# Patient Record
Sex: Female | Born: 1955 | Race: White | Hispanic: No | Smoking: Never smoker
Health system: Southern US, Community
[De-identification: ages and names within clinical notes are randomized; demographics above are authoritative.]

## PROBLEM LIST (undated history)

## (undated) DIAGNOSIS — Z923 Personal history of irradiation: Secondary | ICD-10-CM

## (undated) DIAGNOSIS — I10 Essential (primary) hypertension: Secondary | ICD-10-CM

## (undated) DIAGNOSIS — C50919 Malignant neoplasm of unspecified site of unspecified female breast: Secondary | ICD-10-CM

## (undated) DIAGNOSIS — S72453A Displaced supracondylar fracture without intracondylar extension of lower end of unspecified femur, initial encounter for closed fracture: Secondary | ICD-10-CM

## (undated) DIAGNOSIS — Z9221 Personal history of antineoplastic chemotherapy: Secondary | ICD-10-CM

## (undated) DIAGNOSIS — R7303 Prediabetes: Secondary | ICD-10-CM

## (undated) DIAGNOSIS — E559 Vitamin D deficiency, unspecified: Secondary | ICD-10-CM

---

## 2001-01-07 ENCOUNTER — Emergency Department (HOSPITAL_COMMUNITY): Admission: EM | Admit: 2001-01-07 | Discharge: 2001-01-07 | Payer: Self-pay | Admitting: Emergency Medicine

## 2001-02-15 ENCOUNTER — Ambulatory Visit (HOSPITAL_COMMUNITY): Admission: RE | Admit: 2001-02-15 | Discharge: 2001-02-15 | Payer: Self-pay | Admitting: Orthopedic Surgery

## 2001-02-15 ENCOUNTER — Encounter: Payer: Self-pay | Admitting: Orthopedic Surgery

## 2001-04-13 ENCOUNTER — Emergency Department (HOSPITAL_COMMUNITY): Admission: EM | Admit: 2001-04-13 | Discharge: 2001-04-13 | Payer: Self-pay | Admitting: Emergency Medicine

## 2003-04-14 HISTORY — PX: CARPAL TUNNEL RELEASE: SHX101

## 2003-08-30 ENCOUNTER — Ambulatory Visit (HOSPITAL_BASED_OUTPATIENT_CLINIC_OR_DEPARTMENT_OTHER): Admission: RE | Admit: 2003-08-30 | Discharge: 2003-08-30 | Payer: Self-pay | Admitting: Orthopedic Surgery

## 2005-04-30 ENCOUNTER — Ambulatory Visit: Payer: Self-pay | Admitting: Family Medicine

## 2006-07-18 ENCOUNTER — Emergency Department (HOSPITAL_COMMUNITY): Admission: EM | Admit: 2006-07-18 | Discharge: 2006-07-18 | Payer: Self-pay | Admitting: Emergency Medicine

## 2006-08-16 ENCOUNTER — Emergency Department (HOSPITAL_COMMUNITY): Admission: EM | Admit: 2006-08-16 | Discharge: 2006-08-16 | Payer: Self-pay | Admitting: Emergency Medicine

## 2006-12-01 ENCOUNTER — Ambulatory Visit: Payer: Self-pay | Admitting: Oncology

## 2006-12-03 ENCOUNTER — Encounter (HOSPITAL_COMMUNITY): Admission: RE | Admit: 2006-12-03 | Discharge: 2007-01-05 | Payer: Self-pay | Admitting: Oncology

## 2006-12-05 ENCOUNTER — Encounter: Admission: RE | Admit: 2006-12-05 | Discharge: 2006-12-05 | Payer: Self-pay | Admitting: General Surgery

## 2006-12-07 ENCOUNTER — Ambulatory Visit (HOSPITAL_COMMUNITY): Admission: RE | Admit: 2006-12-07 | Discharge: 2006-12-07 | Payer: Self-pay | Admitting: Oncology

## 2006-12-08 LAB — CBC WITH DIFFERENTIAL/PLATELET
BASO%: 0.5 % (ref 0.0–2.0)
Basophils Absolute: 0 10*3/uL (ref 0.0–0.1)
EOS%: 1.8 % (ref 0.0–7.0)
Eosinophils Absolute: 0.1 10*3/uL (ref 0.0–0.5)
HCT: 37.7 % (ref 34.8–46.6)
HGB: 13.5 g/dL (ref 11.6–15.9)
LYMPH%: 17.2 % (ref 14.0–48.0)
MCH: 30.7 pg (ref 26.0–34.0)
MCHC: 35.8 g/dL (ref 32.0–36.0)
MCV: 85.7 fL (ref 81.0–101.0)
MONO#: 0.5 10*3/uL (ref 0.1–0.9)
MONO%: 6.8 % (ref 0.0–13.0)
NEUT#: 5.3 10*3/uL (ref 1.5–6.5)
NEUT%: 73.7 % (ref 39.6–76.8)
Platelets: 283 10*3/uL (ref 145–400)
RBC: 4.4 10*6/uL (ref 3.70–5.32)
RDW: 13 % (ref 11.3–14.5)
WBC: 7.2 10*3/uL (ref 3.9–10.0)
lymph#: 1.2 10*3/uL (ref 0.9–3.3)

## 2006-12-10 ENCOUNTER — Ambulatory Visit (HOSPITAL_COMMUNITY): Admission: RE | Admit: 2006-12-10 | Discharge: 2006-12-10 | Payer: Self-pay | Admitting: Oncology

## 2006-12-12 LAB — VITAMIN D PNL(25-HYDRXY+1,25-DIHY)-BLD
Vit D, 1,25-Dihydroxy: 56 pg/mL (ref 6–62)
Vit D, 25-Hydroxy: 24 ng/mL (ref 20–57)

## 2006-12-12 LAB — COMPREHENSIVE METABOLIC PANEL
ALT: 18 U/L (ref 0–35)
AST: 18 U/L (ref 0–37)
Albumin: 4.1 g/dL (ref 3.5–5.2)
Alkaline Phosphatase: 95 U/L (ref 39–117)
Glucose, Bld: 119 mg/dL — ABNORMAL HIGH (ref 70–99)
Potassium: 3.9 mEq/L (ref 3.5–5.3)
Sodium: 138 mEq/L (ref 135–145)
Total Bilirubin: 0.4 mg/dL (ref 0.3–1.2)
Total Protein: 6.3 g/dL (ref 6.0–8.3)

## 2006-12-12 LAB — LACTATE DEHYDROGENASE: LDH: 174 U/L (ref 94–250)

## 2006-12-12 LAB — CANCER ANTIGEN 27.29: CA 27.29: 15 U/mL (ref 0–39)

## 2006-12-17 ENCOUNTER — Ambulatory Visit (HOSPITAL_BASED_OUTPATIENT_CLINIC_OR_DEPARTMENT_OTHER): Admission: RE | Admit: 2006-12-17 | Discharge: 2006-12-17 | Payer: Self-pay | Admitting: General Surgery

## 2006-12-17 ENCOUNTER — Encounter (INDEPENDENT_AMBULATORY_CARE_PROVIDER_SITE_OTHER): Payer: Self-pay | Admitting: General Surgery

## 2006-12-21 ENCOUNTER — Ambulatory Visit: Admission: EM | Admit: 2006-12-21 | Discharge: 2006-12-21 | Payer: Self-pay | Admitting: Oncology

## 2006-12-21 ENCOUNTER — Encounter: Payer: Self-pay | Admitting: Oncology

## 2007-01-04 LAB — CBC WITH DIFFERENTIAL/PLATELET
EOS%: 0.4 % (ref 0.0–7.0)
Eosinophils Absolute: 0.1 10*3/uL (ref 0.0–0.5)
LYMPH%: 4.5 % — ABNORMAL LOW (ref 14.0–48.0)
MCH: 30.4 pg (ref 26.0–34.0)
MCV: 87 fL (ref 81.0–101.0)
MONO%: 0.2 % (ref 0.0–13.0)
NEUT#: 18.6 10*3/uL — ABNORMAL HIGH (ref 1.5–6.5)
Platelets: 194 10*3/uL (ref 145–400)
RBC: 4.29 10*6/uL (ref 3.70–5.32)

## 2007-01-04 LAB — COMPREHENSIVE METABOLIC PANEL
AST: 23 U/L (ref 0–37)
Alkaline Phosphatase: 122 U/L — ABNORMAL HIGH (ref 39–117)
BUN: 12 mg/dL (ref 6–23)
Glucose, Bld: 109 mg/dL — ABNORMAL HIGH (ref 70–99)
Sodium: 137 mEq/L (ref 135–145)
Total Bilirubin: 0.6 mg/dL (ref 0.3–1.2)
Total Protein: 5.7 g/dL — ABNORMAL LOW (ref 6.0–8.3)

## 2007-01-14 LAB — CBC WITH DIFFERENTIAL/PLATELET
Basophils Absolute: 0 10*3/uL (ref 0.0–0.1)
Eosinophils Absolute: 0 10*3/uL (ref 0.0–0.5)
HCT: 34.7 % — ABNORMAL LOW (ref 34.8–46.6)
HGB: 12.3 g/dL (ref 11.6–15.9)
LYMPH%: 14.7 % (ref 14.0–48.0)
MCV: 86.1 fL (ref 81.0–101.0)
MONO%: 8.1 % (ref 0.0–13.0)
NEUT#: 6.6 10*3/uL — ABNORMAL HIGH (ref 1.5–6.5)
NEUT%: 76.4 % (ref 39.6–76.8)
Platelets: 182 10*3/uL (ref 145–400)

## 2007-01-19 ENCOUNTER — Ambulatory Visit: Payer: Self-pay | Admitting: Oncology

## 2007-01-21 LAB — CBC WITH DIFFERENTIAL/PLATELET
BASO%: 0.4 % (ref 0.0–2.0)
EOS%: 0.3 % (ref 0.0–7.0)
LYMPH%: 13.6 % — ABNORMAL LOW (ref 14.0–48.0)
MCH: 30.6 pg (ref 26.0–34.0)
MCHC: 35.3 g/dL (ref 32.0–36.0)
MONO#: 0.5 10*3/uL (ref 0.1–0.9)
NEUT%: 79 % — ABNORMAL HIGH (ref 39.6–76.8)
Platelets: 334 10*3/uL (ref 145–400)
RBC: 3.92 10*6/uL (ref 3.70–5.32)
WBC: 8.1 10*3/uL (ref 3.9–10.0)
lymph#: 1.1 10*3/uL (ref 0.9–3.3)

## 2007-01-28 LAB — CBC WITH DIFFERENTIAL/PLATELET
BASO%: 0.7 % (ref 0.0–2.0)
HCT: 33.3 % — ABNORMAL LOW (ref 34.8–46.6)
LYMPH%: 28.4 % (ref 14.0–48.0)
MCHC: 35.4 g/dL (ref 32.0–36.0)
MCV: 87 fL (ref 81.0–101.0)
MONO%: 4.5 % (ref 0.0–13.0)
NEUT%: 62.9 % (ref 39.6–76.8)
Platelets: 129 10*3/uL — ABNORMAL LOW (ref 145–400)
RBC: 3.82 10*6/uL (ref 3.70–5.32)

## 2007-02-10 LAB — CBC WITH DIFFERENTIAL/PLATELET
BASO%: 0.4 % (ref 0.0–2.0)
EOS%: 1.3 % (ref 0.0–7.0)
HCT: 34 % — ABNORMAL LOW (ref 34.8–46.6)
MCH: 31.3 pg (ref 26.0–34.0)
MCHC: 35.7 g/dL (ref 32.0–36.0)
MONO#: 1 10*3/uL — ABNORMAL HIGH (ref 0.1–0.9)
NEUT%: 71 % (ref 39.6–76.8)
RBC: 3.89 10*6/uL (ref 3.70–5.32)
RDW: 15.1 % — ABNORMAL HIGH (ref 11.3–14.5)
WBC: 8 10*3/uL (ref 3.9–10.0)
lymph#: 1.2 10*3/uL (ref 0.9–3.3)

## 2007-02-10 LAB — COMPREHENSIVE METABOLIC PANEL
ALT: 34 U/L (ref 0–35)
AST: 25 U/L (ref 0–37)
Albumin: 3.5 g/dL (ref 3.5–5.2)
CO2: 31 mEq/L (ref 19–32)
Calcium: 9 mg/dL (ref 8.4–10.5)
Chloride: 102 mEq/L (ref 96–112)
Potassium: 3.8 mEq/L (ref 3.5–5.3)
Sodium: 141 mEq/L (ref 135–145)
Total Protein: 6 g/dL (ref 6.0–8.3)

## 2007-02-11 ENCOUNTER — Ambulatory Visit (HOSPITAL_COMMUNITY): Admission: RE | Admit: 2007-02-11 | Discharge: 2007-02-11 | Payer: Self-pay | Admitting: Oncology

## 2007-03-02 ENCOUNTER — Ambulatory Visit: Payer: Self-pay | Admitting: Oncology

## 2007-03-03 LAB — CBC WITH DIFFERENTIAL/PLATELET
BASO%: 0.2 % (ref 0.0–2.0)
EOS%: 0.1 % (ref 0.0–7.0)
HCT: 32.9 % — ABNORMAL LOW (ref 34.8–46.6)
MCH: 31.5 pg (ref 26.0–34.0)
MCHC: 35.8 g/dL (ref 32.0–36.0)
MONO#: 0.7 10*3/uL (ref 0.1–0.9)
NEUT%: 75.5 % (ref 39.6–76.8)
RBC: 3.74 10*6/uL (ref 3.70–5.32)
WBC: 7.4 10*3/uL (ref 3.9–10.0)
lymph#: 1 10*3/uL (ref 0.9–3.3)

## 2007-03-14 LAB — CBC WITH DIFFERENTIAL/PLATELET
Eosinophils Absolute: 0.1 10*3/uL (ref 0.0–0.5)
MONO#: 0.8 10*3/uL (ref 0.1–0.9)
NEUT#: 11.1 10*3/uL — ABNORMAL HIGH (ref 1.5–6.5)
Platelets: 142 10*3/uL — ABNORMAL LOW (ref 145–400)
RBC: 3.59 10*6/uL — ABNORMAL LOW (ref 3.70–5.32)
RDW: 13.2 % (ref 11.3–14.5)
WBC: 13.7 10*3/uL — ABNORMAL HIGH (ref 3.9–10.0)

## 2007-03-17 ENCOUNTER — Ambulatory Visit: Admission: RE | Admit: 2007-03-17 | Discharge: 2007-03-17 | Payer: Self-pay | Admitting: Oncology

## 2007-03-17 ENCOUNTER — Encounter: Payer: Self-pay | Admitting: Oncology

## 2007-03-24 LAB — CBC WITH DIFFERENTIAL/PLATELET
Eosinophils Absolute: 0 10*3/uL (ref 0.0–0.5)
HCT: 32.8 % — ABNORMAL LOW (ref 34.8–46.6)
HGB: 11.4 g/dL — ABNORMAL LOW (ref 11.6–15.9)
LYMPH%: 15.3 % (ref 14.0–48.0)
MONO#: 0.7 10*3/uL (ref 0.1–0.9)
NEUT#: 5 10*3/uL (ref 1.5–6.5)
NEUT%: 73.6 % (ref 39.6–76.8)
Platelets: 320 10*3/uL (ref 145–400)
WBC: 6.8 10*3/uL (ref 3.9–10.0)

## 2007-03-24 LAB — COMPREHENSIVE METABOLIC PANEL
CO2: 28 mEq/L (ref 19–32)
Calcium: 9 mg/dL (ref 8.4–10.5)
Creatinine, Ser: 0.89 mg/dL (ref 0.40–1.20)
Glucose, Bld: 158 mg/dL — ABNORMAL HIGH (ref 70–99)
Total Bilirubin: 0.8 mg/dL (ref 0.3–1.2)
Total Protein: 5.9 g/dL — ABNORMAL LOW (ref 6.0–8.3)

## 2007-03-31 ENCOUNTER — Ambulatory Visit (HOSPITAL_COMMUNITY): Admission: RE | Admit: 2007-03-31 | Discharge: 2007-03-31 | Payer: Self-pay | Admitting: Oncology

## 2007-04-01 LAB — COMPREHENSIVE METABOLIC PANEL
ALT: 68 U/L — ABNORMAL HIGH (ref 0–35)
Albumin: 3.8 g/dL (ref 3.5–5.2)
CO2: 26 mEq/L (ref 19–32)
Calcium: 8.6 mg/dL (ref 8.4–10.5)
Chloride: 106 mEq/L (ref 96–112)
Glucose, Bld: 129 mg/dL — ABNORMAL HIGH (ref 70–99)
Potassium: 3.4 mEq/L — ABNORMAL LOW (ref 3.5–5.3)
Sodium: 141 mEq/L (ref 135–145)
Total Bilirubin: 0.9 mg/dL (ref 0.3–1.2)
Total Protein: 6.1 g/dL (ref 6.0–8.3)

## 2007-04-01 LAB — CBC WITH DIFFERENTIAL/PLATELET
Eosinophils Absolute: 0 10*3/uL (ref 0.0–0.5)
HCT: 29.8 % — ABNORMAL LOW (ref 34.8–46.6)
LYMPH%: 11 % — ABNORMAL LOW (ref 14.0–48.0)
MONO#: 0.8 10*3/uL (ref 0.1–0.9)
NEUT#: 3.8 10*3/uL (ref 1.5–6.5)
Platelets: 199 10*3/uL (ref 145–400)
RBC: 3.26 10*6/uL — ABNORMAL LOW (ref 3.70–5.32)
WBC: 5.3 10*3/uL (ref 3.9–10.0)
lymph#: 0.6 10*3/uL — ABNORMAL LOW (ref 0.9–3.3)

## 2007-04-01 LAB — D-DIMER, QUANTITATIVE: D-Dimer, Quant: 0.3 ug/mL-FEU (ref 0.00–0.48)

## 2007-04-08 ENCOUNTER — Ambulatory Visit: Payer: Self-pay | Admitting: Oncology

## 2007-04-08 LAB — CBC WITH DIFFERENTIAL/PLATELET
BASO%: 1 % (ref 0.0–2.0)
Basophils Absolute: 0 10*3/uL (ref 0.0–0.1)
EOS%: 3.8 % (ref 0.0–7.0)
MCH: 32.2 pg (ref 26.0–34.0)
MCHC: 34.8 g/dL (ref 32.0–36.0)
MCV: 92.5 fL (ref 81.0–101.0)
MONO%: 7.8 % (ref 0.0–13.0)
RBC: 3.59 10*6/uL — ABNORMAL LOW (ref 3.70–5.32)
RDW: 15.5 % — ABNORMAL HIGH (ref 11.3–14.5)

## 2007-04-08 LAB — COMPREHENSIVE METABOLIC PANEL
ALT: 36 U/L — ABNORMAL HIGH (ref 0–35)
AST: 22 U/L (ref 0–37)
Albumin: 3.3 g/dL — ABNORMAL LOW (ref 3.5–5.2)
Alkaline Phosphatase: 63 U/L (ref 39–117)
BUN: 15 mg/dL (ref 6–23)
Potassium: 3.8 mEq/L (ref 3.5–5.3)
Sodium: 142 mEq/L (ref 135–145)

## 2007-04-09 ENCOUNTER — Emergency Department (HOSPITAL_COMMUNITY): Admission: EM | Admit: 2007-04-09 | Discharge: 2007-04-10 | Payer: Self-pay | Admitting: Emergency Medicine

## 2007-04-11 ENCOUNTER — Ambulatory Visit: Admission: RE | Admit: 2007-04-11 | Discharge: 2007-04-11 | Payer: Self-pay | Admitting: Oncology

## 2007-04-14 ENCOUNTER — Encounter: Payer: Self-pay | Admitting: Family Medicine

## 2007-04-14 DIAGNOSIS — C50919 Malignant neoplasm of unspecified site of unspecified female breast: Secondary | ICD-10-CM

## 2007-04-14 HISTORY — PX: BREAST LUMPECTOMY: SHX2

## 2007-04-14 HISTORY — DX: Malignant neoplasm of unspecified site of unspecified female breast: C50.919

## 2007-04-21 LAB — CBC WITH DIFFERENTIAL/PLATELET
Basophils Absolute: 0.1 10*3/uL (ref 0.0–0.1)
EOS%: 3 % (ref 0.0–7.0)
HGB: 12.3 g/dL (ref 11.6–15.9)
MCH: 32 pg (ref 26.0–34.0)
MCV: 91.4 fL (ref 81.0–101.0)
MONO%: 10.9 % (ref 0.0–13.0)
NEUT%: 66.2 % (ref 39.6–76.8)
RDW: 13.9 % (ref 11.3–14.5)

## 2007-04-21 LAB — COMPREHENSIVE METABOLIC PANEL
AST: 24 U/L (ref 0–37)
Alkaline Phosphatase: 80 U/L (ref 39–117)
BUN: 9 mg/dL (ref 6–23)
Creatinine, Ser: 0.73 mg/dL (ref 0.40–1.20)
Potassium: 4.1 mEq/L (ref 3.5–5.3)

## 2007-04-29 LAB — CBC WITH DIFFERENTIAL/PLATELET
Basophils Absolute: 0.1 10*3/uL (ref 0.0–0.1)
EOS%: 6.2 % (ref 0.0–7.0)
HCT: 34.6 % — ABNORMAL LOW (ref 34.8–46.6)
HGB: 11.9 g/dL (ref 11.6–15.9)
MCH: 31.6 pg (ref 26.0–34.0)
MCV: 91.7 fL (ref 81.0–101.0)
MONO%: 7 % (ref 0.0–13.0)
NEUT%: 71.2 % (ref 39.6–76.8)
Platelets: 243 10*3/uL (ref 145–400)

## 2007-05-04 ENCOUNTER — Ambulatory Visit (HOSPITAL_COMMUNITY): Admission: RE | Admit: 2007-05-04 | Discharge: 2007-05-04 | Payer: Self-pay | Admitting: Oncology

## 2007-05-04 LAB — CBC WITH DIFFERENTIAL/PLATELET
BASO%: 0.9 % (ref 0.0–2.0)
Basophils Absolute: 0.1 10*3/uL (ref 0.0–0.1)
Eosinophils Absolute: 0.2 10*3/uL (ref 0.0–0.5)
HCT: 34.3 % — ABNORMAL LOW (ref 34.8–46.6)
HGB: 12.1 g/dL (ref 11.6–15.9)
LYMPH%: 16.8 % (ref 14.0–48.0)
MONO#: 0.2 10*3/uL (ref 0.1–0.9)
NEUT#: 4.2 10*3/uL (ref 1.5–6.5)
NEUT%: 75.1 % (ref 39.6–76.8)
Platelets: 282 10*3/uL (ref 145–400)
WBC: 5.6 10*3/uL (ref 3.9–10.0)
lymph#: 0.9 10*3/uL (ref 0.9–3.3)

## 2007-05-05 LAB — URINALYSIS, MICROSCOPIC - CHCC
Blood: NEGATIVE
Ketones: NEGATIVE mg/dL
Nitrite: NEGATIVE
Protein: NEGATIVE mg/dL
Specific Gravity, Urine: 1.02 (ref 1.003–1.035)

## 2007-05-19 ENCOUNTER — Encounter: Payer: Self-pay | Admitting: Oncology

## 2007-05-19 ENCOUNTER — Ambulatory Visit: Admission: RE | Admit: 2007-05-19 | Discharge: 2007-05-19 | Payer: Self-pay | Admitting: Oncology

## 2007-05-19 LAB — RESEARCH LABS

## 2007-05-19 LAB — COMPREHENSIVE METABOLIC PANEL
ALT: 38 U/L — ABNORMAL HIGH (ref 0–35)
Albumin: 4.5 g/dL (ref 3.5–5.2)
Alkaline Phosphatase: 97 U/L (ref 39–117)
CO2: 27 mEq/L (ref 19–32)
Glucose, Bld: 132 mg/dL — ABNORMAL HIGH (ref 70–99)
Potassium: 4.1 mEq/L (ref 3.5–5.3)
Sodium: 144 mEq/L (ref 135–145)
Total Protein: 6.5 g/dL (ref 6.0–8.3)

## 2007-05-19 LAB — CBC WITH DIFFERENTIAL/PLATELET
Basophils Absolute: 0 10*3/uL (ref 0.0–0.1)
Eosinophils Absolute: 0.5 10*3/uL (ref 0.0–0.5)
HGB: 13.1 g/dL (ref 11.6–15.9)
NEUT#: 5.4 10*3/uL (ref 1.5–6.5)
RDW: 12.7 % (ref 11.3–14.5)
WBC: 7.6 10*3/uL (ref 3.9–10.0)
lymph#: 1.2 10*3/uL (ref 0.9–3.3)

## 2007-05-22 ENCOUNTER — Encounter: Admission: RE | Admit: 2007-05-22 | Discharge: 2007-05-22 | Payer: Self-pay | Admitting: Oncology

## 2007-06-30 ENCOUNTER — Ambulatory Visit: Payer: Self-pay | Admitting: Oncology

## 2007-07-04 LAB — CBC WITH DIFFERENTIAL/PLATELET
Eosinophils Absolute: 0.2 10*3/uL (ref 0.0–0.5)
LYMPH%: 18.4 % (ref 14.0–48.0)
MCHC: 35.1 g/dL (ref 32.0–36.0)
MCV: 87.4 fL (ref 81.0–101.0)
MONO%: 6.2 % (ref 0.0–13.0)
NEUT#: 4.8 10*3/uL (ref 1.5–6.5)
Platelets: 245 10*3/uL (ref 145–400)
RBC: 4.38 10*6/uL (ref 3.70–5.32)

## 2007-07-04 LAB — COMPREHENSIVE METABOLIC PANEL
Alkaline Phosphatase: 100 U/L (ref 39–117)
Creatinine, Ser: 0.81 mg/dL (ref 0.40–1.20)
Glucose, Bld: 105 mg/dL — ABNORMAL HIGH (ref 70–99)
Sodium: 143 mEq/L (ref 135–145)
Total Bilirubin: 0.5 mg/dL (ref 0.3–1.2)
Total Protein: 6.5 g/dL (ref 6.0–8.3)

## 2007-07-13 ENCOUNTER — Encounter (INDEPENDENT_AMBULATORY_CARE_PROVIDER_SITE_OTHER): Payer: Self-pay | Admitting: General Surgery

## 2007-07-13 ENCOUNTER — Ambulatory Visit (HOSPITAL_BASED_OUTPATIENT_CLINIC_OR_DEPARTMENT_OTHER): Admission: RE | Admit: 2007-07-13 | Discharge: 2007-07-13 | Payer: Self-pay | Admitting: General Surgery

## 2007-08-04 LAB — COMPREHENSIVE METABOLIC PANEL
AST: 20 U/L (ref 0–37)
Albumin: 4.2 g/dL (ref 3.5–5.2)
Alkaline Phosphatase: 118 U/L — ABNORMAL HIGH (ref 39–117)
Potassium: 3.9 mEq/L (ref 3.5–5.3)
Sodium: 143 mEq/L (ref 135–145)
Total Protein: 6.6 g/dL (ref 6.0–8.3)

## 2007-08-04 LAB — CBC WITH DIFFERENTIAL/PLATELET
EOS%: 2.1 % (ref 0.0–7.0)
MCH: 30.4 pg (ref 26.0–34.0)
MCV: 85.8 fL (ref 81.0–101.0)
MONO%: 6.2 % (ref 0.0–13.0)
NEUT#: 5.4 10*3/uL (ref 1.5–6.5)
RBC: 4.37 10*6/uL (ref 3.70–5.32)
RDW: 12.9 % (ref 11.3–14.5)

## 2007-08-08 ENCOUNTER — Ambulatory Visit: Admission: RE | Admit: 2007-08-08 | Discharge: 2007-11-06 | Payer: Self-pay | Admitting: Radiation Oncology

## 2007-08-08 ENCOUNTER — Ambulatory Visit (HOSPITAL_COMMUNITY): Admission: RE | Admit: 2007-08-08 | Discharge: 2007-08-08 | Payer: Self-pay | Admitting: Oncology

## 2007-08-12 LAB — CBC WITH DIFFERENTIAL/PLATELET
BASO%: 0.8 % (ref 0.0–2.0)
EOS%: 2.3 % (ref 0.0–7.0)
HCT: 37.3 % (ref 34.8–46.6)
LYMPH%: 18 % (ref 14.0–48.0)
MCH: 30.1 pg (ref 26.0–34.0)
MCHC: 34.9 g/dL (ref 32.0–36.0)
NEUT%: 71.1 % (ref 39.6–76.8)
RBC: 4.31 10*6/uL (ref 3.70–5.32)
lymph#: 1.2 10*3/uL (ref 0.9–3.3)

## 2007-08-12 LAB — COMPREHENSIVE METABOLIC PANEL
ALT: 33 U/L (ref 0–35)
AST: 24 U/L (ref 0–37)
Creatinine, Ser: 0.79 mg/dL (ref 0.40–1.20)
Sodium: 141 mEq/L (ref 135–145)
Total Bilirubin: 0.5 mg/dL (ref 0.3–1.2)

## 2007-09-02 ENCOUNTER — Ambulatory Visit: Payer: Self-pay | Admitting: Oncology

## 2007-09-27 ENCOUNTER — Encounter: Payer: Self-pay | Admitting: Oncology

## 2007-09-27 ENCOUNTER — Ambulatory Visit: Admission: RE | Admit: 2007-09-27 | Discharge: 2007-09-27 | Payer: Self-pay | Admitting: Oncology

## 2007-10-03 ENCOUNTER — Ambulatory Visit (HOSPITAL_COMMUNITY): Admission: RE | Admit: 2007-10-03 | Discharge: 2007-10-03 | Payer: Self-pay | Admitting: Radiation Oncology

## 2007-10-18 ENCOUNTER — Ambulatory Visit: Payer: Self-pay | Admitting: Cardiology

## 2007-10-18 ENCOUNTER — Ambulatory Visit: Payer: Self-pay | Admitting: Oncology

## 2007-10-21 LAB — CBC WITH DIFFERENTIAL/PLATELET
Basophils Absolute: 0.1 10*3/uL (ref 0.0–0.1)
Eosinophils Absolute: 0.3 10*3/uL (ref 0.0–0.5)
HCT: 38.9 % (ref 34.8–46.6)
HGB: 13.3 g/dL (ref 11.6–15.9)
LYMPH%: 12 % — ABNORMAL LOW (ref 14.0–48.0)
MCV: 86.8 fL (ref 81.0–101.0)
MONO%: 10.4 % (ref 0.0–13.0)
NEUT#: 5.1 10*3/uL (ref 1.5–6.5)
NEUT%: 72.7 % (ref 39.6–76.8)
Platelets: 212 10*3/uL (ref 145–400)
RDW: 12.2 % (ref 11.3–14.5)

## 2007-10-21 LAB — COMPREHENSIVE METABOLIC PANEL
AST: 29 U/L (ref 0–37)
Alkaline Phosphatase: 124 U/L — ABNORMAL HIGH (ref 39–117)
BUN: 15 mg/dL (ref 6–23)
Creatinine, Ser: 0.79 mg/dL (ref 0.40–1.20)
Glucose, Bld: 142 mg/dL — ABNORMAL HIGH (ref 70–99)
Potassium: 3.7 mEq/L (ref 3.5–5.3)
Total Bilirubin: 0.3 mg/dL (ref 0.3–1.2)

## 2007-10-21 LAB — FOLLICLE STIMULATING HORMONE: FSH: 54.7 m[IU]/mL

## 2007-10-24 LAB — URINALYSIS, MICROSCOPIC - CHCC
Bilirubin (Urine): NEGATIVE
Glucose: NEGATIVE g/dL
Ketones: NEGATIVE mg/dL
pH: 6 (ref 4.6–8.0)

## 2007-10-25 LAB — URINE CULTURE

## 2007-10-29 LAB — ESTRADIOL, ULTRA SENS

## 2007-11-10 LAB — CBC WITH DIFFERENTIAL/PLATELET
BASO%: 0.3 % (ref 0.0–2.0)
EOS%: 3.5 % (ref 0.0–7.0)
MCH: 30 pg (ref 26.0–34.0)
MCHC: 34.4 g/dL (ref 32.0–36.0)
MONO#: 0.5 10*3/uL (ref 0.1–0.9)
NEUT%: 72.9 % (ref 39.6–76.8)
RBC: 4.51 10*6/uL (ref 3.70–5.32)
RDW: 12.9 % (ref 11.3–14.5)
WBC: 6 10*3/uL (ref 3.9–10.0)
lymph#: 0.9 10*3/uL (ref 0.9–3.3)

## 2007-11-10 LAB — COMPREHENSIVE METABOLIC PANEL
ALT: 47 U/L — ABNORMAL HIGH (ref 0–35)
AST: 35 U/L (ref 0–37)
CO2: 27 mEq/L (ref 19–32)
Calcium: 9.3 mg/dL (ref 8.4–10.5)
Chloride: 106 mEq/L (ref 96–112)
Creatinine, Ser: 0.87 mg/dL (ref 0.40–1.20)
Potassium: 4 mEq/L (ref 3.5–5.3)
Sodium: 140 mEq/L (ref 135–145)
Total Protein: 6.3 g/dL (ref 6.0–8.3)

## 2007-12-12 ENCOUNTER — Ambulatory Visit: Payer: Self-pay | Admitting: Cardiology

## 2007-12-12 LAB — CONVERTED CEMR LAB
Cholesterol: 207 mg/dL (ref 0–200)
HDL: 34.7 mg/dL — ABNORMAL LOW (ref >39.0)
Total CHOL/HDL Ratio: 6
Triglycerides: 115 mg/dL (ref 0–149)
VLDL: 23 mg/dL (ref 0–40)

## 2007-12-21 ENCOUNTER — Ambulatory Visit: Payer: Self-pay | Admitting: Oncology

## 2008-02-03 LAB — CBC WITH DIFFERENTIAL/PLATELET
Basophils Absolute: 0 10*3/uL (ref 0.0–0.1)
EOS%: 2.8 % (ref 0.0–7.0)
Eosinophils Absolute: 0.1 10*3/uL (ref 0.0–0.5)
HGB: 12.9 g/dL (ref 11.6–15.9)
LYMPH%: 19.6 % (ref 14.0–48.0)
MCH: 30 pg (ref 26.0–34.0)
MCV: 86.4 fL (ref 81.0–101.0)
MONO%: 9.2 % (ref 0.0–13.0)
NEUT#: 3.4 10*3/uL (ref 1.5–6.5)
Platelets: 245 10*3/uL (ref 145–400)

## 2008-02-03 LAB — COMPREHENSIVE METABOLIC PANEL
Alkaline Phosphatase: 102 U/L (ref 39–117)
BUN: 13 mg/dL (ref 6–23)
Creatinine, Ser: 0.94 mg/dL (ref 0.40–1.20)
Glucose, Bld: 120 mg/dL — ABNORMAL HIGH (ref 70–99)
Total Bilirubin: 1 mg/dL (ref 0.3–1.2)

## 2008-02-06 ENCOUNTER — Ambulatory Visit: Payer: Self-pay | Admitting: Oncology

## 2008-04-19 ENCOUNTER — Ambulatory Visit: Payer: Self-pay | Admitting: Oncology

## 2008-04-23 LAB — COMPREHENSIVE METABOLIC PANEL
ALT: 36 U/L — ABNORMAL HIGH (ref 0–35)
AST: 30 U/L (ref 0–37)
CO2: 30 mEq/L (ref 19–32)
Calcium: 9.1 mg/dL (ref 8.4–10.5)
Chloride: 101 mEq/L (ref 96–112)
Creatinine, Ser: 1.06 mg/dL (ref 0.40–1.20)
Sodium: 140 mEq/L (ref 135–145)
Total Bilirubin: 0.6 mg/dL (ref 0.3–1.2)
Total Protein: 6.3 g/dL (ref 6.0–8.3)

## 2008-04-23 LAB — CBC WITH DIFFERENTIAL/PLATELET
BASO%: 0.3 % (ref 0.0–2.0)
EOS%: 2.4 % (ref 0.0–7.0)
Eosinophils Absolute: 0.1 10*3/uL (ref 0.0–0.5)
LYMPH%: 17.5 % (ref 14.0–48.0)
MCH: 30.3 pg (ref 26.0–34.0)
MCHC: 34.8 g/dL (ref 32.0–36.0)
MCV: 87 fL (ref 81.0–101.0)
MONO%: 7.7 % (ref 0.0–13.0)
Platelets: 226 10*3/uL (ref 145–400)
RBC: 4.4 10*6/uL (ref 3.70–5.32)

## 2008-06-29 ENCOUNTER — Ambulatory Visit: Payer: Self-pay | Admitting: Oncology

## 2008-08-03 ENCOUNTER — Ambulatory Visit: Payer: Self-pay | Admitting: Oncology

## 2008-08-14 ENCOUNTER — Encounter: Payer: Self-pay | Admitting: Cardiology

## 2008-10-03 ENCOUNTER — Ambulatory Visit: Payer: Self-pay | Admitting: Oncology

## 2008-11-01 ENCOUNTER — Encounter: Payer: Self-pay | Admitting: Internal Medicine

## 2008-11-08 ENCOUNTER — Ambulatory Visit (HOSPITAL_BASED_OUTPATIENT_CLINIC_OR_DEPARTMENT_OTHER): Admission: RE | Admit: 2008-11-08 | Discharge: 2008-11-08 | Payer: Self-pay | Admitting: General Surgery

## 2008-11-14 ENCOUNTER — Ambulatory Visit: Payer: Self-pay | Admitting: Oncology

## 2008-12-28 ENCOUNTER — Ambulatory Visit: Payer: Self-pay | Admitting: Oncology

## 2009-12-18 ENCOUNTER — Emergency Department (HOSPITAL_COMMUNITY): Admission: EM | Admit: 2009-12-18 | Discharge: 2009-12-18 | Payer: Self-pay | Admitting: Emergency Medicine

## 2010-05-04 ENCOUNTER — Encounter: Payer: Self-pay | Admitting: Oncology

## 2010-05-05 ENCOUNTER — Encounter: Payer: Self-pay | Admitting: Oncology

## 2010-05-13 NOTE — Letter (Signed)
Summary: rpc chart  rpc chart   Imported By: Curtis Sites 01/20/2010 11:24:00  _____________________________________________________________________  External Attachment:    Type:   Image     Comment:   External Document

## 2010-08-26 NOTE — Op Note (Signed)
NAMEAMATULLAH, CHRISTY                 ACCOUNT NO.:  [id number]   MEDICAL RECORD NO.:  [id number]          PATIENT TYPE:  AMB   LOCATION:  DSC                          FACILITY:  [facility]   PHYSICIAN:  Anselm Pancoast. Weatherly, M.D.DATE OF BIRTH:  06/11/1955   DATE OF PROCEDURE:  07/13/2007  DATE OF DISCHARGE:                               OPERATIVE REPORT   PREOPERATIVE DIAGNOSES:  History of cancer of the right breast at about  4 o'clock position.   OPERATION:  Needle-localized lumpectomy, partial mastectomy and also a  sentinel node.   ANESTHESIA:  General.   HISTORY:  Delita Chiquito is a 55 year old female, whom I first saw about  September of last year, when she had a very large mass in the right  breast, about 4-6 o'clock position, that was bigger than an egg and it  had been biopsied by Dr. Isaiah Serge with the findings of an obvious  intraductal carcinoma.  She saw Dr. Darnelle Catalan and he recommended the  reduction chemotherapy, which she has completed, and the mass is  completely gone and even a followup MRI and mammogram showed no  abnormalities.  Fortunately, they had placed a clip in the area at the  time of the original biopsy, and she is here today for a lumpectomy and  sentinel node.  When I saw her about four weeks ago preoperatively, she  had an infection on her leg that was significantly inflamed and this was  a MRSA infection and she was treated with doxycycline.  The leg has  recovered and the surgery was postponed about two weeks in getting over  this MRSA infection.  She is here now for the planned procedure and I  gave her a gram of vancomycin preoperatively because of the history of  MRSA, instead of the usual Ancef preoperatively normally used.   The patient had had wires placed by Dr. Isaiah Serge, bracketing the area.  Since there is nothing palpable and the area of Dr. Shirlee More bracketing  is closer to 3 o'clock, when my notes and the original mammogram had  said 4-6 o'clock.   First, she had been injected with the radioactive  contrast and then taken back to the operating suite, induction of  general anesthesia.  With the probe, I could pick up some warm nodes  shortly afterwards and we prepped her.  I also injected the methylene  blue in the subareolar area, not over in the medial quadrant, but the  other three quadrants.   First, I elected to make a little incision, after using the probe, and  went down.  She is very heavy and kind of deep in the axilla and one  node that was not large, but blue, and had a count of 225, was  identified, and then three other little nodes that had cancer and about  70 in the immediate same location.  A few clips were used and then 4-0  Vicryl was used for the closure of the subcutaneous tissue and then  later put 5-0 nylon simple skin stitches.   Next, I directed my attention to  the lumpectomy site.  The wires, I had  clipped them off and they had retracted up under the skin with prepping  of the breast.  I made the incision and I extended it down a little bit  more inferior, since my notes and all had definitely said 4 to 6 o'clock  and the bracketed were approximately 3:30 o'clock.  The skin and  immediate subcutaneous breast tissue were separated from the actual true  breast tissue and then the lateral wire was first identified, then  medially and then basically a large biopsy was performed, taken right on  down to the underlying fascia.  There was nothing in the breast tissue  that looks or feels like cancer, and then, after the area had been  excised, we x-rayed it and the clip was definitely between the two wires  and the orientation will just have to go because I am not sure from the  orientation there is nothing more superficial or deeper that would be  possible.   Closure of the wound was kind of difficult because there was such  absence of breast tissue that kind of bringing up the inferior aspect to  try to get  her a mound without a flattened area was done.  I actually  put in some sutures and removed them since, if I tend to pull it in more  medial and to the areolar edge, it gives her less of an absence of  breast tissue.  Several little bleeders had been sutured with 4-0  Vicryl, also used for cautery, and the subcutaneous tissue being closed  and the breast kind of reapproximated, I then used first a 4-0 undyed  Vicryl and then a few 5-0 nylon simple sutures in the skin edges and  Steri-Strips and benzoin on the skin.  I had button-holed one little  area where it was right at the edge of the areolar complex, when we got  into blue dye in the center, and I placed a couple of simple sutures in  this also.   The patient tolerated the procedure nicely and will be released after a  short stay and I will see her back in the office in approximately one  week.  Dr. Isaiah Serge reviewed the pictures.  The area was definitely  removed.  She also could not see or feel anything that looked like  cancer in the specimen.           ______________________________  Anselm Pancoast. Zachery Dakins, M.D.     WJW/MEDQ  D:  07/13/2007  T:  07/13/2007  Job:  [id number]

## 2010-08-26 NOTE — Assessment & Plan Note (Signed)
[facility]                            CARDIOLOGY OFFICE NOTE   NAME:Isabella [name], Isabella [name]                          MRN:          [id number]  DATE:10/18/2007                            DOB:          03-25-1956    REFERRING PHYSICIAN:  Artist Pais. Kathrynn Running, MD   REASON FOR CONSULTATION:  Evaluate the patient with chest pain.   HISTORY OF PRESENT ILLNESS:  The patient is a pleasant 55 year old white  female without prior cardiac history.  She is currently undergoing  treatment for breast cancer.  Over the last couple of months, she has  had a left upper chest discomfort.  It radiates under her left breast.  It radiates to her left shoulder.  She has had occasional sharp  discomfort in her right shoulder as well.  She describes the pain as a  poking discomfort.  At its peak, it is 7/10 in intensity.  It waxes and  wanes.  It can be associated with sweats.  Recently, she had been fairly  nauseated, unrelated to the pain.  She did not have pain like this prior  to this.  She has had an orthopedic workup including apparently an MRI.  I am not sure of the outcome of this though she says verbal report of  the phone demonstrated no acute disease.  She has not found anything  that can get rid of the pain.  The pain is not positional.  She cannot  bring it on with activity.  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  She now referred for further  evaluation given very significant family history of heart disease.   The patient did have an echocardiogram a couple of weeks ago.  This  demonstrated a well-preserved ejection fraction.  There were no valvular  abnormalities.  There were no pericardial effusion.   PAST MEDICAL HISTORY:  Breast cancer status post lumpectomy, Herceptin  therapy, and currently undergoing radiation.  She has no history of  hypertension, diabetes, or hyperlipidemia.   PAST SURGICAL HISTORY:  1. Hand  surgery in 2006.  2. Breast lumpectomy in April 2009.   ALLERGIES:  None.   MEDICATIONS:  1. Ambien.  2. Potassium 20 mEq daily.  3. Multivitamin.  4. Flaxseed.  5. Vitamin E.  6. Fish oil.  7. Tylenol.  8. Omeprazole.  9. Effexor 37.5 mg p.r.n.  10.Lorazepam.   SOCIAL HISTORY:  The patient works for [employer].  She is  divorced.  She has 3 children and 4 grandchildren.  She has never smoked  cigarettes.  She does not drink alcohol.   FAMILY HISTORY:  Very remarkable for early coronary disease.  Father  died of an MI at 65.  She has a brother alive at 85 with an MI and  another stepbrother with an MI at an early age.  She has a stepsister  who died in early age with myocardial infarction.   REVIEW OF SYSTEMS:  As stated in the HPI and positive for  lightheadedness, reflux, leg swelling, chronic back  pain related to  previous accident.  Negative for other systems.   PHYSICAL EXAMINATION:  GENERAL:  The patient is pleasant and in no  distress.  VITAL SIGNS:  Blood pressure 121/86, heart rate 67 and regular, weight  226 pounds, and body mass index 38.  HEENT:  Eyelids are unremarkable.  Pupils are equal, round, and reactive  to light.  Fundi not visualized.  Oral mucosa unremarkable.  NECK:  No jugular venous distention to 45 degrees.  Carotid upstroke  brisk and symmetrical.  No bruits, no thyromegaly.  LYMPHATICS:  No cervical, axillary, or inguinal adenopathy.  LUNGS:  Clear to auscultation bilaterally.  BACK:  Mild point tenderness over the midthoracic spine.  No  costovertebral angle tenderness.  CHEST:  Mild tenderness to palpation anteriorly and over the left  breast.  There is a right breast scar with mild erythema.  There is a  Port-A-Cath.  HEART:  PMI not displaced or sustained.  S1 and S2 within normal.  No  S3, no S4, no clicks, no rubs, or no murmurs.  ABDOMEN:  Obese, positive bowel sounds.  Normal in frequency and pitch.  No bruits, no rebound, no  guarding or midline pulsatile mass.  No  hepatomegaly, no splenomegaly.  SKIN:  No rashes, no nodules.  EXTREMITIES:  Pulses 2+ throughout.  Mild bilateral lower extremity  edema above the ankles.  NEUROLOGIC:  Oriented to person, place, and time.  Cranial nerves II-XII  grossly intact.  Motor grossly intact.   EKG, sinus rhythm, rate 67, axis within normal limits, intervals within  normal limits, premature ventricular contractions, no acute ST-T wave  changes.   ASSESSMENT AND PLAN:  1. Chest pain.  The patient's chest pain is atypical for coronary      disease.  The pretest probability of obstructive coronary disease      is very low.  However, with a family history, I think she would      need screening for this indication rather than for evaluation of      the pain.  A plain old exercise treadmill (POET) will be useful in      excluding and confirming the low pretest probability of obstructive      coronary artery disease.  We can risk stratify and give her a      prescription for exercise as well.  I would suggest that her pain      is more likely musculoskeletal, less likely a gastrointestinal.  2. Risk reduction.  The patient has a very strong family history.  She      says she had a normal cholesterol done by the health department.      I would like to repeat this as I would be aggressive with managing      her LDL and HDL given that family history.  We will repeat this      when she comes back for her plain old exercise treadmill.  3. Followup.  We will see her at the time of her stress test.     Rollene Rotunda, MD, [facility]  Electronically Signed    JH/MedQ  DD: 10/18/2007  DT: 10/19/2007  Job #: [id number]   cc:   Artist Pais Kathrynn Running, M.D.

## 2010-08-26 NOTE — Op Note (Signed)
[name], [name]                 ACCOUNT NO.:  [id number]   MEDICAL RECORD NO.:  [id number]          PATIENT TYPE:  AMB   LOCATION:  DSC                          FACILITY:  [facility]   PHYSICIAN:  Anselm Pancoast. Weatherly, M.D.DATE OF BIRTH:  09/19/1955   DATE OF PROCEDURE:  DATE OF DISCHARGE:                               OPERATIVE REPORT   PREOPERATIVE DIAGNOSIS:  Carcinoma of the right breast and she has got  two moles in the inframammillary crease on the left she desires excised.   OPERATION:  Placement of a Port-A-Cath, left subclavian and excision  tangential 2 moles, left upper chest abdomen wall.   Local was sedation.   SURGEON:  Anselm Pancoast. Zachery Dakins, M.D.   HISTORY:  Isabella [name] is a 55 year old female who was referred to me  from the breast center where actually it was Dr. Isaiah Serge where she had  done a mammogram and core biopsy that confirmed that she has got a  pretty large carcinoma of the right breast.  She was presented to the  breast cancer conference, has seen Dr. Darnelle Catalan and then are planning to  do preoperative chemotherapy to see if it can be reduced and incised so  that possibly a lumpectomy and radiation therapy will be possible at a  later time.  She is supposed to start her treatment in approximately 10  days and is here today for Port-A-Cath placement on her left.  She has  got 2 moles in the inframammillary crease on the left that she said are  bothersome, especially in the hot weather, that she desired to be  excised and we will tangentially excise those at the completion of the  Port-A-Cath placement.   Preoperatively, she was given a gram of Ancef.  She has been identified,  the patient marked, understand the planned procedure and had been given  a gram of Ancef.  She was positioned on the OR table.  A roll placed  under the upper mid back and then induction of sedation with a mask and  intravenous and then the breast and left upper chest wall, et  Karie Soda,  was prepped with Betadine scrub and solution.  She was draped in a  sterile manner and then with her laying flat on the OR table, I placed a  hemostat where I think the junction of the superior vena cava and the  atrium should be and then brought in the C-arm.  Everybody was covered  with the lead aprons, et Karie Soda, and then on the positioning, it looked  like the hemostat was probably about an inch higher I a slipped it down  just slightly.  I then used 1% plain Xylocaine to anesthetize the left  subclavian area and then on the second pass of the needle, the vein was  entered.  The guide wire was inserted and slipped on over into the  superior vena cava nicely.  I then placed her flat.  I had her in the  Trendelenburg position while we were actually doing the needle insertion  with the guide wire and then created the  little subcutaneous pocket  located above the breast so hope that it will still be covered by her  bra and created the little pocket with Metzenbaum scissors dissection  and a few little vessels were either coagulated or sutured with 3-0  chromic.  Next, the detachable Port-A-Cath reservoir was sutured to the  fascia.  The sutures were not tied with 2-0 Prolene and then she was  placed back in the Trendelenburg position and then the introducer was  slipped over the guide wire and a catheter was slipped in place.  We had  entered the atrium with the Silastic catheter and I put her flat and was  positioned, and it looked like that we had pulled it up a little higher  in the superior vena cava than I would desire and I slipped the guide  wire back into the wire to make it a little stiffer so I could slide it  back to the area that we had previously just decided on.   Next, the catheter had been tunneled subcutaneously so it could be  hooked up to the reservoir.  Slipped it over the reservoir after  trimming the catheter the appropriate length and then slipped a  little  locking device over it.  We then anchored the Prolene sutures, its lines  comfortable.  She is a little heavy and getting it down on the fascia  you will have to kind of press a little bit to feel the reservoir but I  was able to stick it with Demetrios Isaacs needle easily, aspirated the saline and  got blood put in dilute and then I put in about 4 mL of 100 units of  heparin per mL of the heparin saline solution.  I then used some 3-0  chromic sutures to kind of close the subcutaneous tissue around the  reservoir and then four 5-0 Vicryl subcuticular sutures and then benzoin  and Steri-Strips on the skin.  The patient tolerated the procedure  nicely and experienced minimal discomfort during the procedure I think.  I then placed a little Xylocaine, a little wheal under the two  moles,  one of them was about a cm in size but it is kind of on a pedicle and I  tangentially excised it and then lightly cauterized the little cutaneous  bleeders.  The smaller one is located a little more laterally and we  sent it together.  The larger one is the medial lesion.  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   Patient tolerated the procedure nicely and was sent to the recovery room  breathing spontaneously.  We will get a chest x-ray and she should be  able to start her chemotherapy in approximately a week.           ______________________________  Anselm Pancoast. Zachery Dakins, M.D.     WJW/MEDQ  D:  12/17/2006  T:  12/17/2006  Job:  [id number]

## 2010-08-26 NOTE — Op Note (Signed)
Isabella [name], Isabella [name]                 ACCOUNT NO.:  [id number]   MEDICAL RECORD NO.:  [id number]          PATIENT TYPE:  AMB   LOCATION:  DSC                          FACILITY:  [facility]   PHYSICIAN:  Anselm Pancoast. Weatherly, M.D.DATE OF BIRTH:  January 02, 1956   DATE OF PROCEDURE:  DATE OF DISCHARGE:                               OPERATIVE REPORT   PREOPERATIVE DIAGNOSES:  Port-A-Cath non-use, history of cancer at the  right breast but no chemotherapy treatments for 7 months, desires  removal of Port-A-Cath.   PROCEDURE:  Removal of Port-A-Cath, left subclavian area.   ANESTHESIA:  Local anesthesia.   HISTORY:  Isabella [name] is a 55 year old female, who 2 years ago had a  cancer of the right breast, received chemotherapy, lumpectomy, and  radiation and has completed her treatment in December.  She is  clinically without evidence of disease and desires her Port-A-Cath be  removed.  She has not been on Coumadin recently, and she is here for  removal of the Port-A-Cath.  The area was prepped with Betadine  solution, a time-out was completed, and the area was infiltrated in most  proximal portion of the incision with 1% Xylocaine with adrenaline.  Small incision about half of the insertion site was opened.  The Port-A-  Cath was easily felt and dissected down on the Port-A-Cath which is the  plastic reservoir.  Little sutures anchoring it could be visualized, the  first string was divided and then the area medially, and then the last  suture was removed allowing the Port-A-Cath be brought at the skin  level.  With this, I sort of freed up the proximal portion where the  catheter goes in and a 4-0 Vicryl suture was placed around it.  The Port-  A-Cath removed and the little suture tied in figure-of-eight fashion.  The subcutaneous tissue and defect was closed with the 4-0 Vicryl  interrupted sutures.  Benzoin and Steri-Strips were placed on the skin.  The patient tolerated the procedure nicely with  minimal discomfort, no  bleeding, and she will be released after a short stay.  She will keep  little Steri-Strips in place for approximately a week, wait until  Saturday to shower.  I will see her in followup in approximately 2  weeks.  We will give her Darvocet-N 100 for pain if needed.      Anselm Pancoast. Zachery Dakins, M.D.  Electronically Signed     Anselm Pancoast. Zachery Dakins, M.D.  Electronically Signed    WJW/MEDQ  D:  11/08/2008  T:  11/09/2008  Job:  [id number]

## 2010-08-26 NOTE — Procedures (Signed)
[facility]                              EXERCISE TREADMILL   NAME:Isabella [name], Isabella [name]                          MRN:          [id number]  DATE:12/12/2007                            DOB:          [birth year]    PROCEDURE:  Exercise treadmill test.   INDICATIONS:  Evaluate the patient with chest pain.   PROCEDURE NOTE:  The patient exercised using standard Bruce protocol.  She was only able to exercise for 6 minutes.  This completed stage II.  She achieved 7.0 METs.  She did achieve a peak heart rate of 157, which  was 93% of predicted.  She had an accelerated blood pressure response  with a maximum of 178/81 in exercise.  The test was terminated because  she had achieved her target heart rate and because of fatigue.  She had  poor exercise tolerance.  She did have some chest pressure.  Her dyspnea  was somewhat out of proportion to the level of exertion.  She had no  arrhythmias.  There were no ischemic ST-T wave changes.  She had a  normal heart rate recovery.   CONCLUSION:  Negative adequate exercise treadmill test.  The patient did  have some symptoms but no ischemia on EKG.  Based on this, I see no  findings consistent with high-grade obstructive coronary artery disease  in a major epicardial vessel.   PLAN:  1. No further cardiovascular testing is suggested, given this finding      and the atypical nature of her symptoms.  Should she have any more      discomfort or worsening symptoms going forward, I might consider      reevaluation, however, and would be happy to see her back.  2. Risk reduction.  She is getting a lipid profile today.  3. Followup.  I will see her back based on future symptoms.      Rollene Rotunda, MD, [facility]  Electronically Signed    JH/MedQ  DD: 12/12/2007  DT: 12/13/2007  Job #: [id number]   cc:   Artist Pais Kathrynn Running, M.D.

## 2010-08-29 NOTE — Op Note (Signed)
NAMECEIRA, HOESCHEN                             ACCOUNT NO.:  [id number]   MEDICAL RECORD NO.:  [id number]                   PATIENT TYPE:  AMB   LOCATION:  DSC                                  FACILITY:  [facility]   PHYSICIAN:  Katy Fitch. Naaman Plummer., M.D.          DATE OF BIRTH:  [birth year]   DATE OF PROCEDURE:  08/30/2003  DATE OF DISCHARGE:                                 OPERATIVE REPORT   PREOPERATIVE DIAGNOSIS:  1. Chronic right wrist and upper extremity pain, allegedly consequent to     injury August 02, 2002, with clinical evidence of carpal tunnel syndrome     documented by EMG nerve conduction study completed by Laurier Nancy, M.D., on February 02, 2003, and confirmed by second opinion     consultation by Dr. Anne Ng Comen at [facility] on June 20, 2003.  2. Chronic wrist pain with clinical evidence of a possible triangular     fibrocartilage tear, a slight ulnar minus variant, with prior MRI     obtained at [facility] interpreted by radiologist Dr. Stephani Police to reveal a triangular fibrocartilage tear.  My over-read of the     films was such that I was not as certain as Dr. Katrinka Blazing that there was a     triangular fibrocartilage tear.   We have recommended diagnostic arthroscopy to confirm the triangular  fibrocartilage pathology and/or discern the etiology of the chronic wrist  pain.   POSTOPERATIVE DIAGNOSES:  1. Chronic right wrist and upper extremity pain, allegedly consequent to     injury August 02, 2002, with clinical evidence of carpal tunnel syndrome     documented by EMG nerve conduction study completed by Laurier Nancy, M.D., on February 02, 2003, and confirmed by second opinion     consultation by Dr. Anne Ng Comen at [facility] on June 20, 2003.  2. Chronic wrist pain with clinical evidence of a possible triangular     fibrocartilage tear, a  slight ulnar minus variant, with prior MRI     obtained at [facility] interpreted by radiologist Dr. Stephani Police to reveal a triangular fibrocartilage tear.  My over-read of the     films was such that I was not as certain as Dr. Katrinka Blazing that there was a     triangular fibrocartilage tear.   We have recommended diagnostic arthroscopy to confirm the triangular  fibrocartilage pathology and/or discern the etiology of the chronic wrist  pain.   OPERATION:  1. Diagnostic arthroscopy, right wrist, with identification of a large plica-     type structure blocking visualization of the ulnocarpal joint from the     3/4 dorsal portal requiring additional visualization through a 4/5 dorsal  portal and a 6R portal.  The triangular fibrocartilage is noted to have     minor degenerative change.  There were no signs of ulnocarpal abutment.  2. Release of right transverse carpal ligament.   OPERATING SURGEON:  Katy Fitch. Sypher, M.D.   ASSISTANT:  Jonni Sanger, P.A.   ANESTHESIA:  General by LMA, supervising anesthesiologist is Janetta Hora.  Gelene Mink, M.D.   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   She was initially evaluated in September 2004, reporting an injury in June  2004 to her right wrist and elbow.   At that time she stated she was scrubbing paint off a counter top and  sustained a twisting injury to her wrist.  She had a popping sensation in  her wrist and on clinical examination showed signs of probable internal  derangement with a crepitation and tenderness on translation of the  ulnocarpal joint suggestive of a triangular fibrocartilage tear.   Plain film suggested that she was ulnar neutral to slightly minus.   We requested an MRI scan from [facility] and, unfortunately, the insurer   rescheduled the MRI scan arthrogram at [facility].   A technically compromised study was completed, which I found difficult to  interpret.   The radiologist interpreted it as a 5-6 mm triangular fibrocartilage tear.   Ms. Panik did not improve with management, including splinting and anti-  inflammatory medication as well as work modification.   She was seen for a second opinion consultation by Dr. Lora Paula,  professor of hand surgery and orthopedics at [facility].  On June 20, 2003, Dr. Duffy Rhody formed an opinion that Ms.  Sassaman did indeed have carpal tunnel syndrome that was aggravated by her  injury but probable pre-existent.  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   He concurred that diagnostic arthroscopy as a diagnostic intervention was  reasonable.   After informed consent with Ms. Repetto, at which time no guarantees had  been made or implied as to outcome, she is brought to the operating room at  this time anticipating diagnostic arthroscopy, possible triangular  fibrocartilage debridement, and addressing intra-articular pathology as  found.  We also anticipate release of the transverse carpal ligament due to  chronic entrapment neuropathy symptoms.   PROCEDURE:  Catori Panozzo was brought to the operating room and placed in the  supine position on the operating table.  Following the induction of general  anesthesia by LMA, the right arm was prepped with Betadine soap and solution  and sterilely draped.   Following exsanguination of the right arm with an Esmarch bandage, an  arterial tourniquet on the proximal brachium was inflated to 220 mmHg.   Fingertraps were placed on the index, long, and ring fingers, and the wrist  was distracted in a tower designed for wrist arthroscopy with 10 pounds of traction across the wrist joint and counter  traction on the forearm with  towel padding.   The scope was introduced with blunt technique through a standard 3/4 dorsal  portal.  Diagnostic arthroscopy revealed intact hyaline articular cartilage  surfaces on the scaphoid and radial aspect of the lunate with an intact  scapholunate interosseous ligament.  The scaphoid and lunate facet of the  distal radius were  noted to be normal.   With attempted passage of the scope to the ulnocarpal joint, there was  significant resistance compatible with a plica-like structure.   We created a 4/5 dorsal portal and subsequently a 6R portal and explored the  ulnocarpal articulation.   Indeed, a rather substantial infolding of the capsule was noted that was  rather fibrotic.   This could be the source of her crepitation and perhaps the source of her  discomfort.   A 2 mm suction shaver was brought into the 3/4 dorsal portal and under  direct vision, the plica-like structure was debrided with visualization from  6R.   The triangular fibrocartilage was inspected and found to be covered with a  usual amount of synovitis.  There was minor degenerative change but no sign  of a destabilizing injury.   There were absolutely no signs of ulnocarpal abutment.   The radiocarpal articulation and ulnocarpal articulation were thoroughly  lavaged with sterile saline, followed by photographic documentation of the  findings.  The scope equipment was removed and retired from the field.   The hand was removed from the tower providing traction and was subsequently  placed on an arm board.  We proceeded with a release of the right transverse  carpal ligament.   A short incision was fashioned in the line of the ring finger in the palm.  Subcutaneous tissues were carefully divided, revealing the palmar fascia.  This was split in line of its fibers, revealing the common sensory branch of  the median nerve and the superficial palmar arch.   The common sensory  branches were followed back to the median nerve proper.  After a gentle isolation of the nerve from the transverse carpal ligament,  the ligament was released along its ulnar border with scissors, extending  into the distal forearm.   This widely opened the carpal canal.  No masses or other predicaments were  noted.   Bleeding points along the margin of the released ligament were  electrocauterized with bipolar current, followed by repair of the skin with  intradermal 3-0 Prolene suture.   A compressive dressing was applied with a volar plaster splint maintaining  the wrist in 5 degrees of dorsiflexion.   Marcaine 0.25% was infiltrated for postoperative analgesia along the  incision and portal sites.   For aftercare Ms. Maj is given prescriptions for Percocet 5 mg one or  two tablet p.o. q.4-6h. p.r.n. pain, 20 tablets without refill, also Keflex  500 mg one p.o. q.8h. x4 days as a prophylactic antibiotic.                                              Katy Fitch Naaman Plummer., M.D.    RVS/MEDQ  D:  08/30/2003  T:  08/31/2003  Job:  [id number]

## 2011-01-05 LAB — COMPREHENSIVE METABOLIC PANEL
ALT: 30
AST: 21
CO2: 31
Chloride: 104
Creatinine, Ser: 0.86
GFR calc Af Amer: >60
GFR calc non Af Amer: >60
Glucose, Bld: 105 — ABNORMAL HIGH
Sodium: 139
Total Bilirubin: 0.5

## 2011-01-05 LAB — CBC
Hemoglobin: 12.9
MCHC: 34.4
MCV: 88.3
RBC: 4.26
WBC: 6.1

## 2011-01-05 LAB — DIFFERENTIAL
Basophils Relative: 0
Eosinophils Absolute: 0.1
Eosinophils Relative: 2
Lymphs Abs: 1.2
Monocytes Relative: 7

## 2011-08-24 ENCOUNTER — Telehealth: Payer: Self-pay | Admitting: Oncology

## 2011-08-24 NOTE — Telephone Encounter (Signed)
Call Isabella [name] for follow-up for the NSABP B-41 study.  She states she is doing good.  She still has no insurance.  The only complaint she has is dry skin.  She is not on any hormonal therapy because she cannot afford it. I will call again in 6 months.

## 2012-04-20 ENCOUNTER — Telehealth: Payer: Self-pay | Admitting: Oncology

## 2012-04-20 NOTE — Telephone Encounter (Signed)
Called and spoke with patient on phone for follow-up to the B-41 study.  She states that she still has not insurance so she has not been to the doctor, had no mammogram and cannot afford to take any medicine. She states she guess she is doing fine other than she has no energy.  She stays tired a lot.

## 2012-06-11 ENCOUNTER — Encounter (HOSPITAL_COMMUNITY): Payer: Self-pay

## 2012-06-11 ENCOUNTER — Emergency Department (HOSPITAL_COMMUNITY): Payer: Self-pay

## 2012-06-11 ENCOUNTER — Emergency Department (HOSPITAL_COMMUNITY)
Admission: EM | Admit: 2012-06-11 | Discharge: 2012-06-11 | Disposition: A | Discharge status: Home / Self Care | Payer: Self-pay | Attending: Emergency Medicine | Admitting: Emergency Medicine

## 2012-06-11 DIAGNOSIS — S34109A Unspecified injury to unspecified level of lumbar spinal cord, initial encounter: Secondary | ICD-10-CM | POA: Insufficient documentation

## 2012-06-11 DIAGNOSIS — S161XXA Strain of muscle, fascia and tendon at neck level, initial encounter: Secondary | ICD-10-CM

## 2012-06-11 DIAGNOSIS — Z853 Personal history of malignant neoplasm of breast: Secondary | ICD-10-CM | POA: Insufficient documentation

## 2012-06-11 DIAGNOSIS — Y92410 Unspecified street and highway as the place of occurrence of the external cause: Secondary | ICD-10-CM | POA: Insufficient documentation

## 2012-06-11 DIAGNOSIS — S139XXA Sprain of joints and ligaments of unspecified parts of neck, initial encounter: Secondary | ICD-10-CM | POA: Insufficient documentation

## 2012-06-11 DIAGNOSIS — S39012A Strain of muscle, fascia and tendon of lower back, initial encounter: Secondary | ICD-10-CM

## 2012-06-11 DIAGNOSIS — Y939 Activity, unspecified: Secondary | ICD-10-CM | POA: Insufficient documentation

## 2012-06-11 DIAGNOSIS — S335XXA Sprain of ligaments of lumbar spine, initial encounter: Secondary | ICD-10-CM | POA: Insufficient documentation

## 2012-06-11 MED ORDER — TRAMADOL HCL 50 MG PO TABS: 50.0000 mg | ORAL_TABLET | Freq: Four times a day (QID) | ORAL | Status: DC | PRN

## 2012-06-11 MED ORDER — IBUPROFEN 800 MG PO TABS: 800.0000 mg | ORAL_TABLET | Freq: Three times a day (TID) | ORAL | Status: DC

## 2012-06-11 MED ORDER — CYCLOBENZAPRINE HCL 10 MG PO TABS: 10.0000 mg | ORAL_TABLET | Freq: Two times a day (BID) | ORAL | Status: DC | PRN
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
## 2012-06-11 NOTE — ED Notes (Signed)
MD at bedside. 
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
## 2013-09-25 ENCOUNTER — Emergency Department (HOSPITAL_COMMUNITY)
Admission: EM | Admit: 2013-09-25 | Discharge: 2013-09-26 | Disposition: A | Discharge status: Home / Self Care | Payer: Self-pay | Attending: Emergency Medicine | Admitting: Emergency Medicine

## 2013-09-25 ENCOUNTER — Encounter (HOSPITAL_COMMUNITY): Payer: Self-pay | Admitting: Emergency Medicine

## 2013-09-25 DIAGNOSIS — J209 Acute bronchitis, unspecified: Secondary | ICD-10-CM | POA: Insufficient documentation

## 2013-09-25 DIAGNOSIS — H9209 Otalgia, unspecified ear: Secondary | ICD-10-CM | POA: Insufficient documentation

## 2013-09-25 DIAGNOSIS — J3489 Other specified disorders of nose and nasal sinuses: Secondary | ICD-10-CM | POA: Insufficient documentation

## 2013-09-25 DIAGNOSIS — J029 Acute pharyngitis, unspecified: Secondary | ICD-10-CM | POA: Insufficient documentation

## 2013-09-25 DIAGNOSIS — Z859 Personal history of malignant neoplasm, unspecified: Secondary | ICD-10-CM | POA: Insufficient documentation

## 2013-09-25 MED ORDER — BENZONATATE 100 MG PO CAPS
200.0000 mg | ORAL_CAPSULE | Freq: Once | ORAL | Status: AC
  Administered 2013-09-25: 200 mg via ORAL
  Filled 2013-09-25: qty 2

## 2013-09-25 MED ORDER — AZITHROMYCIN 250 MG PO TABS: ORAL_TABLET | ORAL | Status: DC

## 2013-09-25 MED ORDER — AZITHROMYCIN 250 MG PO TABS
500.0000 mg | ORAL_TABLET | Freq: Once | ORAL | Status: AC
  Administered 2013-09-25: 500 mg via ORAL
  Filled 2013-09-25: qty 2

## 2013-09-25 MED ORDER — ALBUTEROL SULFATE HFA 108 (90 BASE) MCG/ACT IN AERS
2.0000 | INHALATION_SPRAY | RESPIRATORY_TRACT | Status: DC | PRN
  Administered 2013-09-25: 2 via RESPIRATORY_TRACT
  Filled 2013-09-25: qty 6.7

## 2013-09-25 MED ORDER — BENZONATATE 200 MG PO CAPS: 200.0000 mg | ORAL_CAPSULE | Freq: Three times a day (TID) | ORAL | Status: DC | PRN

## 2013-09-25 NOTE — ED Notes (Signed)
Patient c/o cough, sore throat and ear pain x 6 weeks.  Patient states she will need a work note.

## 2013-09-25 NOTE — Discharge Instructions (Signed)
Acute Bronchitis Bronchitis is inflammation of the airways that extend from the windpipe into the lungs (bronchi). The inflammation often causes mucus to develop. This leads to a cough, which is the most common symptom of bronchitis.  In acute bronchitis, the condition usually develops suddenly and goes away over time, usually in a couple weeks. Smoking, allergies, and asthma can make bronchitis worse. Repeated episodes of bronchitis may cause further lung problems.  CAUSES Acute bronchitis is most often caused by the same virus that causes a cold. The virus can spread from person to person (contagious).  SIGNS AND SYMPTOMS   Cough.   Fever.   Coughing up mucus.   Body aches.   Chest congestion.   Chills.   Shortness of breath.   Sore throat.  DIAGNOSIS  Acute bronchitis is usually diagnosed through a physical exam. Tests, such as chest X-rays, are sometimes done to rule out other conditions.  TREATMENT  Acute bronchitis usually goes away in a couple weeks. Often times, no medical treatment is necessary. Medicines are sometimes given for relief of fever or cough. Antibiotics are usually not needed but may be prescribed in certain situations. In some cases, an inhaler may be recommended to help reduce shortness of breath and control the cough. A cool mist vaporizer may also be used to help thin bronchial secretions and make it easier to clear the chest.  HOME CARE INSTRUCTIONS  Get plenty of rest.   Drink enough fluids to keep your urine clear or pale yellow (unless you have a medical condition that requires fluid restriction). Increasing fluids may help thin your secretions and will prevent dehydration.   Only take over-the-counter or prescription medicines as directed by your health care provider.   Avoid smoking and secondhand smoke. Exposure to cigarette smoke or irritating chemicals will make bronchitis worse. If you are a smoker, consider using nicotine gum or skin  patches to help control withdrawal symptoms. Quitting smoking will help your lungs heal faster.   Reduce the chances of another bout of acute bronchitis by washing your hands frequently, avoiding people with cold symptoms, and trying not to touch your hands to your mouth, nose, or eyes.   Follow up with your health care provider as directed.  SEEK MEDICAL CARE IF: Your symptoms do not improve after 1 week of treatment.  SEEK IMMEDIATE MEDICAL CARE IF:  You develop an increased fever or chills.   You have chest pain.   You have severe shortness of breath.  You have bloody sputum.   You develop dehydration.  You develop fainting.  You develop repeated vomiting.  You develop a severe headache. MAKE SURE YOU:   Understand these instructions.  Will watch your condition.  Will get help right away if you are not doing well or get worse. Document Released: 05/07/2004 Document Revised: 11/30/2012 Document Reviewed: 09/20/2012 [facility] Patient Information 2014 [place].   Use the medicines as prescribed, taking your next dose of zithromax tomorrow evening.  You may use your inhaler every 4 hours (2 puffs) if you are wheezing.  Get rechecked for any worsened symptoms.  Rest and make sure you are drinking plenty of fluids.

## 2013-09-26 NOTE — ED Provider Notes (Signed)
Medical screening examination/treatment/procedure(s) were performed by non-physician practitioner and as supervising physician I was immediately available for consultation/collaboration.   EKG Interpretation None        Alfonzo Feller, DO 09/26/13 1609

## 2013-09-26 NOTE — ED Provider Notes (Signed)
CSN: [id number]     Arrival date & time 09/25/13  2138 History   First MD Initiated Contact with Patient 09/25/13 2239     Chief Complaint  Patient presents with  . Generalized Body Aches     (Consider location/radiation/quality/duration/timing/severity/associated sxs/prior Treatment) HPI Comments: Isabella [name] is a 58 y.o. Female presenting with a 7 day history of uri type symptoms which includes nasal congestion with clear rhinorrhea, sore throat with post nasal drip, sneezing and intermittent wheezing which worsens at night and is consistent with past episodes of bronchitis.   Symptoms due to not include shortness of breath, chest pain,  Peripheral edema, nausea, vomiting or diarrhea.  Additionally she has complaint of right ear pressure which is a chronic sx for at least the past 6 months.   The patient has taken multiple otc remedies including tylenol cough and cold relief prior to arrival with no significant improvement in symptoms.       The history is provided by the patient.    Past Medical History  Diagnosis Date  . Cancer    Past Surgical History  Procedure Laterality Date  . Breast surgery     No family history on file. History  Substance Use Topics  . Smoking status: Never Smoker   . Smokeless tobacco: Not on file  . Alcohol Use: No   OB History   Grav Para Term Preterm Abortions TAB SAB Ect Mult Living                 Review of Systems  Constitutional: Negative for fever and chills.  HENT: Positive for congestion, ear pain, rhinorrhea, sinus pressure and sore throat. Negative for ear discharge, facial swelling, hearing loss, trouble swallowing and voice change.   Eyes: Negative for discharge.  Respiratory: Positive for cough and wheezing. Negative for shortness of breath and stridor.   Cardiovascular: Negative for chest pain.  Gastrointestinal: Negative for abdominal pain.  Genitourinary: Negative.       Allergies  Review of patient's allergies  indicates no known allergies.  Home Medications   Prior to Admission medications   Medication Sig Start Date End Date Taking? Authorizing Kailo Kosik  azithromycin (ZITHROMAX) 250 MG tablet One tablet daily for 4 additional days. 09/26/13   Evalee Jefferson, PA-C  benzonatate (TESSALON) 200 MG capsule Take 1 capsule (200 mg total) by mouth 3 (three) times daily as needed for cough. 09/25/13   Evalee Jefferson, PA-C   BP 130/79  Pulse 95  Temp(Src) 98.4 F (36.9 C) (Oral)  Resp 20  Ht 5\' 5"  (1.651 m)  Wt 228 lb 4.8 oz (103.556 kg)  BMI 37.99 kg/m2  SpO2 98% Physical Exam  Constitutional: She is oriented to person, place, and time. She appears well-developed and well-nourished.  HENT:  Head: Normocephalic and atraumatic.  Right Ear: Tympanic membrane and ear canal normal.  Left Ear: Tympanic membrane and ear canal normal.  Nose: Mucosal edema and rhinorrhea present.  Mouth/Throat: Uvula is midline, oropharynx is clear and moist and mucous membranes are normal. No oropharyngeal exudate, posterior oropharyngeal edema, posterior oropharyngeal erythema or tonsillar abscesses.  Eyes: Conjunctivae are normal.  Cardiovascular: Normal rate and normal heart sounds.   Pulmonary/Chest: Effort normal. No respiratory distress. She has no wheezes. She has no rales.  Abdominal: Soft. There is no tenderness.  Musculoskeletal: Normal range of motion. She exhibits no edema.  Neurological: She is alert and oriented to person, place, and time.  Skin: Skin is warm and dry. No  rash noted.  Psychiatric: She has a normal mood and affect.    ED Course  Procedures (including critical care time) Labs Review Labs Reviewed - No data to display  Imaging Review No results found.   EKG Interpretation None      MDM   Final diagnoses:  Bronchitis with bronchospasm    Patients labs and/or radiological studies were viewed and considered during the medical decision making and disposition process. Sx c/w bronchitis  although no wheezing appreciated at exam.  She was given an albuterol mdi  With instructions for home use.  Also started on zithromax, tessalon prescribed for cough.  Encouraged rest, increased fluid intake,  Recheck if sx not improved with this tx.      Evalee Jefferson, PA-C 09/26/13 1404

## 2015-01-01 ENCOUNTER — Observation Stay (HOSPITAL_BASED_OUTPATIENT_CLINIC_OR_DEPARTMENT_OTHER)
Admission: EM | Admit: 2015-01-01 | Discharge: 2015-01-02 | Disposition: A | Discharge status: Home / Self Care | Payer: Medicaid Other | Attending: Family Medicine | Admitting: Family Medicine

## 2015-01-01 ENCOUNTER — Emergency Department (HOSPITAL_BASED_OUTPATIENT_CLINIC_OR_DEPARTMENT_OTHER): Payer: Medicaid Other

## 2015-01-01 ENCOUNTER — Ambulatory Visit (HOSPITAL_COMMUNITY): Payer: Medicaid Other

## 2015-01-01 ENCOUNTER — Encounter (HOSPITAL_BASED_OUTPATIENT_CLINIC_OR_DEPARTMENT_OTHER): Payer: Self-pay

## 2015-01-01 DIAGNOSIS — R0602 Shortness of breath: Secondary | ICD-10-CM | POA: Insufficient documentation

## 2015-01-01 DIAGNOSIS — Z853 Personal history of malignant neoplasm of breast: Secondary | ICD-10-CM | POA: Insufficient documentation

## 2015-01-01 DIAGNOSIS — R079 Chest pain, unspecified: Secondary | ICD-10-CM

## 2015-01-01 DIAGNOSIS — Z792 Long term (current) use of antibiotics: Secondary | ICD-10-CM | POA: Diagnosis not present

## 2015-01-01 DIAGNOSIS — F419 Anxiety disorder, unspecified: Secondary | ICD-10-CM | POA: Diagnosis not present

## 2015-01-01 DIAGNOSIS — E669 Obesity, unspecified: Secondary | ICD-10-CM | POA: Insufficient documentation

## 2015-01-01 DIAGNOSIS — R739 Hyperglycemia, unspecified: Secondary | ICD-10-CM | POA: Diagnosis not present

## 2015-01-01 DIAGNOSIS — Z6838 Body mass index (BMI) 38.0-38.9, adult: Secondary | ICD-10-CM | POA: Insufficient documentation

## 2015-01-01 DIAGNOSIS — E785 Hyperlipidemia, unspecified: Secondary | ICD-10-CM | POA: Diagnosis not present

## 2015-01-01 DIAGNOSIS — R0789 Other chest pain: Secondary | ICD-10-CM | POA: Diagnosis not present

## 2015-01-01 LAB — TROPONIN I
Troponin I: <0.03 ng/mL (ref <0.031)
Troponin I: <0.03 ng/mL (ref <0.031)

## 2015-01-01 LAB — CBC
HEMATOCRIT: 40 % (ref 36.0–46.0)
HEMOGLOBIN: 13.2 g/dL (ref 12.0–15.0)
MCH: 29.1 pg (ref 26.0–34.0)
MCHC: 33 g/dL (ref 30.0–36.0)
MCV: 88.3 fL (ref 78.0–100.0)
Platelets: 227 10*3/uL (ref 150–400)
RBC: 4.53 MIL/uL (ref 3.87–5.11)
RDW: 12.3 % (ref 11.5–15.5)
WBC: 7.9 10*3/uL (ref 4.0–10.5)

## 2015-01-01 LAB — BASIC METABOLIC PANEL
ANION GAP: 8 (ref 5–15)
BUN: 16 mg/dL (ref 6–20)
CALCIUM: 8.8 mg/dL — AB (ref 8.9–10.3)
CO2: 28 mmol/L (ref 22–32)
Chloride: 105 mmol/L (ref 101–111)
Creatinine, Ser: 0.8 mg/dL (ref 0.44–1.00)
GFR calc Af Amer: >60 mL/min (ref >60)
Glucose, Bld: 124 mg/dL — ABNORMAL HIGH (ref 65–99)
POTASSIUM: 3.8 mmol/L (ref 3.5–5.1)
SODIUM: 141 mmol/L (ref 135–145)

## 2015-01-01 LAB — D-DIMER, QUANTITATIVE (NOT AT ARMC)

## 2015-01-01 MED ORDER — MORPHINE SULFATE (PF) 2 MG/ML IV SOLN
2.0000 mg | INTRAVENOUS | Status: DC | PRN
  Administered 2015-01-01: 2 mg via INTRAVENOUS
  Filled 2015-01-01 (×2): qty 1

## 2015-01-01 MED ORDER — ALPRAZOLAM 0.25 MG PO TABS: 0.2500 mg | ORAL_TABLET | Freq: Two times a day (BID) | ORAL | Status: DC | PRN

## 2015-01-01 MED ORDER — ENOXAPARIN SODIUM 40 MG/0.4ML ~~LOC~~ SOLN
40.0000 mg | SUBCUTANEOUS | Status: DC
  Administered 2015-01-01 – 2015-01-02 (×2): 40 mg via SUBCUTANEOUS
  Filled 2015-01-01 (×2): qty 0.4

## 2015-01-01 MED ORDER — SODIUM CHLORIDE 0.9 % IV BOLUS (SEPSIS)
1000.0000 mL | Freq: Once | INTRAVENOUS | Status: AC
  Administered 2015-01-01: 1000 mL via INTRAVENOUS

## 2015-01-01 MED ORDER — FENTANYL CITRATE (PF) 100 MCG/2ML IJ SOLN
50.0000 ug | Freq: Once | INTRAMUSCULAR | Status: AC
  Administered 2015-01-01: 50 ug via INTRAVENOUS
  Filled 2015-01-01: qty 2

## 2015-01-01 MED ORDER — ONDANSETRON HCL 4 MG/2ML IJ SOLN: 4.0000 mg | Freq: Four times a day (QID) | INTRAMUSCULAR | Status: DC | PRN

## 2015-01-01 MED ORDER — GI COCKTAIL ~~LOC~~: 30.0000 mL | Freq: Four times a day (QID) | ORAL | Status: DC | PRN

## 2015-01-01 MED ORDER — ASPIRIN EC 325 MG PO TBEC
325.0000 mg | DELAYED_RELEASE_TABLET | Freq: Every day | ORAL | Status: DC
  Administered 2015-01-01 – 2015-01-02 (×2): 325 mg via ORAL
  Filled 2015-01-01 (×2): qty 1

## 2015-01-01 MED ORDER — NITROGLYCERIN 0.4 MG SL SUBL
0.4000 mg | SUBLINGUAL_TABLET | SUBLINGUAL | Status: AC | PRN
  Administered 2015-01-01 (×3): 0.4 mg via SUBLINGUAL
  Filled 2015-01-01: qty 1

## 2015-01-01 MED ORDER — ZOLPIDEM TARTRATE 5 MG PO TABS
5.0000 mg | ORAL_TABLET | Freq: Every evening | ORAL | Status: DC | PRN
  Administered 2015-01-01: 5 mg via ORAL
  Filled 2015-01-01: qty 1

## 2015-01-01 MED ORDER — ACETAMINOPHEN 325 MG PO TABS
650.0000 mg | ORAL_TABLET | ORAL | Status: DC | PRN
  Administered 2015-01-01 (×2): 650 mg via ORAL
  Filled 2015-01-01: qty 2

## 2015-01-01 MED ORDER — ASPIRIN 81 MG PO CHEW
324.0000 mg | CHEWABLE_TABLET | Freq: Once | ORAL | Status: AC
  Administered 2015-01-01: 324 mg via ORAL
  Filled 2015-01-01: qty 4

## 2015-01-01 MED ORDER — METOPROLOL TARTRATE 25 MG PO TABS
25.0000 mg | ORAL_TABLET | Freq: Two times a day (BID) | ORAL | Status: DC
  Administered 2015-01-01 – 2015-01-02 (×3): 25 mg via ORAL
  Filled 2015-01-01 (×3): qty 1

## 2015-01-01 NOTE — Consult Note (Signed)
Cardiologist:  Hochrein  Reason for Consult: Chest pain Referring Physician:    [name] is an 59 y.o. female.  HPI:   The patient is a 59 yo obese female with a history of breast cancer, chemo, radiation and lumpectomy.  She reports for the last week she's been having fairly constant chest pain. It occurs at rest and is 7-8 out of 10 in intensity at the worst. This past Friday nauseated profusely sweating. She stated felt like an ice pick going into her chest. This lasted about 15 minutes. She also felt like she did not pass out and there was radiation into her left shoulder blade. Exertion does not seem to make this worse.  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 She is on no medications.  Family history is significant for brother with cardiac disease and her father died at age 82 after taking a nap he never woke up. She had exercise treadmill August 2009 which was negative.  She has had some lower extremity edema for the last 6 months or so; worse on the left.  She's also had problems with dizziness particularly with position change and elevated heart rate/palpitations.  She was given 3 sublingual nitroglycerin in the emergency emergency room which slowly eased the pain. Morphine made the biggest difference. The patient currently denies  vomiting, fever, orthopnea, PND, cough, congestion, abdominal pain, hematochezia, melena, claudication.'     Past Medical History  Diagnosis Date  . Cancer     Past Surgical History  Procedure Laterality Date  . Breast surgery      No family history on file.  Social History:  reports that she has never smoked. She does not have any smokeless tobacco history on file. She reports that she does not drink alcohol or use illicit drugs.  Allergies: No Known Allergies  Medications: Scheduled Meds: . aspirin EC  325 mg Oral Daily  . enoxaparin (LOVENOX) injection  40 mg Subcutaneous Q24H  .  metoprolol tartrate  25 mg Oral BID   Continuous Infusions:  PRN Meds:.acetaminophen, ALPRAZolam, gi cocktail, morphine injection, ondansetron (ZOFRAN) IV, zolpidem   Results for orders placed or performed during the hospital encounter of 01/01/15 (from the past 48 hour(s))  Basic metabolic panel     Status: Abnormal   Collection Time: 01/01/15  3:30 AM  Result Value Ref Range   Sodium 141 135 - 145 mmol/L   Potassium 3.8 3.5 - 5.1 mmol/L   Chloride 105 101 - 111 mmol/L   CO2 28 22 - 32 mmol/L   Glucose, Bld 124 (H) 65 - 99 mg/dL   BUN 16 6 - 20 mg/dL   Creatinine, Ser 0.80 0.44 - 1.00 mg/dL   Calcium 8.8 (L) 8.9 - 10.3 mg/dL   GFR calc non Af Amer >60 >60 mL/min   GFR calc Af Amer >60 >60 mL/min    Comment: (NOTE) The eGFR has been calculated using the CKD EPI equation. This calculation has not been validated in all clinical situations. eGFR's persistently <60 mL/min signify possible Chronic Kidney Disease.    Anion gap 8 5 - 15  CBC     Status: None   Collection Time: 01/01/15  3:30 AM  Result Value Ref Range   WBC 7.9 4.0 - 10.5 K/uL   RBC 4.53 3.87 - 5.11 MIL/uL   Hemoglobin 13.2 12.0 - 15.0 g/dL   HCT 40.0 36.0 - 46.0 %  MCV 88.3 78.0 - 100.0 fL   MCH 29.1 26.0 - 34.0 pg   MCHC 33.0 30.0 - 36.0 g/dL   RDW 12.3 11.5 - 15.5 %   Platelets 227 150 - 400 K/uL  Troponin I     Status: None   Collection Time: 01/01/15  3:30 AM  Result Value Ref Range   Troponin I <0.03 <0.031 ng/mL    Comment:        NO INDICATION OF MYOCARDIAL INJURY.   D-dimer, quantitative (not at [facility])     Status: None   Collection Time: 01/01/15  3:30 AM  Result Value Ref Range   D-Dimer, Quant <0.27 0.00 - 0.48 ug/mL-FEU    Comment:        AT THE INHOUSE ESTABLISHED CUTOFF VALUE OF 0.48 ug/mL FEU, THIS ASSAY HAS BEEN DOCUMENTED IN THE LITERATURE TO HAVE A SENSITIVITY AND NEGATIVE PREDICTIVE VALUE OF AT LEAST 98 TO 99%.  THE TEST RESULT SHOULD BE CORRELATED WITH AN ASSESSMENT OF THE  CLINICAL PROBABILITY OF DVT / VTE.     Dg Chest 2 View  01/01/2015   CLINICAL DATA:  Intermittent LEFT chest pain and tightness for last week radiating to RIGHT and to back, worsening, today associated with dizziness, weakness and diaphoresis  EXAM: CHEST  2 VIEW  COMPARISON:  05/25/2009  FINDINGS: Normal heart size, mediastinal contours, and pulmonary vascularity.  Lungs clear.  No pneumothorax.  Bones unremarkable.  IMPRESSION: Normal exam.   Electronically Signed   By: Lavonia Dana M.D.   On: 01/01/2015 04:22    Review of Systems  Constitutional: Positive for diaphoresis. Negative for fever.  HENT: Negative for congestion and sore throat.   Respiratory: Positive for shortness of breath. Negative for cough.   Cardiovascular: Positive for chest pain and palpitations.  Gastrointestinal: Positive for nausea. Negative for vomiting, abdominal pain, blood in stool and melena.  Genitourinary: Negative for hematuria.  Musculoskeletal: Positive for back pain.  Neurological: Positive for dizziness.  Endo/Heme/Allergies: Environmental allergies: left shoulder.  All other systems reviewed and are negative.  Blood pressure 128/72, pulse 59, temperature 97.7 F (36.5 C), temperature source Oral, resp. rate 18, height _0  (1.626 m), weight 226 lb 6.4 oz (102.694 kg), SpO2 99 %. Physical Exam  Nursing note and vitals reviewed. Constitutional: She is oriented to person, place, and time. She appears well-developed. No distress.  Obese  HENT:  Head: Normocephalic.  Eyes: EOM are normal. Pupils are equal, round, and reactive to light. No scleral icterus.  Neck: Normal range of motion. Neck supple.  Cardiovascular: Normal rate, regular rhythm, S1 normal and S2 normal.   No murmur heard. Pulses:      Radial pulses are 2+ on the right side, and 2+ on the left side.       Dorsalis pedis pulses are 2+ on the right side, and 2+ on the left side.  Respiratory: Effort normal and breath sounds normal. She  has no wheezes. She has no rales.  GI: Soft. Bowel sounds are normal. She exhibits no distension. There is no tenderness.  Musculoskeletal: She exhibits edema.  Trace left lower extremity edema  Neurological: She is alert and oriented to person, place, and time. She exhibits normal muscle tone.  Skin: Skin is warm and dry.  Psychiatric: She has a normal mood and affect.    Assessment/Plan: Principal Problem:   Chest pain at rest   Anxiety   Acute hyperglycemia   Dizziness  Patient's constellation of symptoms certainly  could be worrisome for cardiac cause. However, she has no acute EKG changes. Troponin is negative 2.  Nitroglycerin does not seem to affect the intensity of the pain.  Her last  stress test was in 2009.  She continues to be very active in the past week and this does not seem to affect the intensity of the pain.  Her father died at age 37, which may have been sudden cardiac death, but is unknown and her brother has coronary disease.  Other risk factor is obesity.  She does not smoke.  No know DM but is hyperglycemic.  D-dimer is negative. This does not appear cardiac however, I recommend doing a Lexiscan Myoview.    She does report dizziness with position change. We'll check orthostatic vital signs. There is nothing acute on telemetry thus far continue to monitor.  She may require outpatient heart monitoring.  Check A1C  HAGER, BRYAN, PAC 01/01/2015, 9:55 AM    As above, patient seen and examined. Briefly she is a 59 year old female with a past medical history of breast cancer for evaluation of chest pain. Patient states that for the past 10 days she has had occasional chest pain. It is in the left chest area and described as a stabbing pain. She also has some pain in her neck and back. The pain is not exertional. It does increase with inspiration. Does have some dizziness with sitting up. Patient admitted by primary care. Electrocardiogram shows sinus rhythm with no  significant ST changes. Enzymes negative. D-dimer normal. Patient also notes some pedal edema when driving long distances. Symptoms are atypical. Electrocardiogram and enzymes unremarkable. We will arrange a stress nuclear study for risk stratification. Schedule echocardiogram to assess LV function given history of breast cancer/chemotherapy. Note d-dimer is normal essentially excluding pulmonary embolus. Kirk Ruths

## 2015-01-01 NOTE — ED Notes (Signed)
MD at bedside. 

## 2015-01-01 NOTE — Progress Notes (Signed)
*  PRELIMINARY RESULTS* Echocardiogram 2D Echocardiogram has been performed.  Special, Ranes 01/01/2015, 3:08 PM

## 2015-01-01 NOTE — Progress Notes (Signed)
Pt arrived to floor in NAD, VSS, no pain at this time, admitting MD paged. Will continue to monitor. Ronnette Hila, RN

## 2015-01-01 NOTE — ED Notes (Signed)
Pt transferred to Center bed 3, via [facility] with belongings.

## 2015-01-01 NOTE — ED Provider Notes (Signed)
TIME SEEN: 3:30 AM  CHIEF COMPLAINT: Chest pain  HPI: Pt is a 59 y.o. female with history of breast cancer now in remission who presents to the emergency department with left-sided chest pain that she describes as a "squeezing" with shortness of breath, nausea, diaphoresis, dizziness, palpitations and feeling like she is going to pass out. Symptoms have been present intermittently for the past 2 days. States she also has a sharp pain that radiates into her back. She states she has tried to improve her symptoms by putting more pillows underneath her when she sleeps without any relief. She is not sure of her pain is exertional and she states that she has not been exerting herself because she is scared that she could be having a heart attack. 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 She does have a family history of a grandmother and a half-sister who have had cardiac disease. Reports her half-sister was young and had multiple heart attacks. She denies any fever or cough.  PCP [facility] with [facility]  ROS: See HPI Constitutional: no fever  Eyes: no drainage  ENT: no runny nose   Cardiovascular:   chest pain  Resp:  SOB  GI: no vomiting GU: no dysuria Integumentary: no rash  Allergy: no hives  Musculoskeletal: no leg swelling  Neurological: no slurred speech ROS otherwise negative  PAST MEDICAL HISTORY/PAST SURGICAL HISTORY:  Past Medical History  Diagnosis Date  . Cancer     MEDICATIONS:  Prior to Admission medications   Medication Sig Start Date End Date Taking? Authorizing Provider  azithromycin (ZITHROMAX) 250 MG tablet One tablet daily for 4 additional days. 09/26/13   Evalee Jefferson, PA-C  benzonatate (TESSALON) 200 MG capsule Take 1 capsule (200 mg total) by mouth 3 (three) times daily as needed for cough. 09/25/13   Evalee Jefferson, PA-C    ALLERGIES:  No Known Allergies  SOCIAL HISTORY:  Social  History  Substance Use Topics  . Smoking status: Never Smoker   . Smokeless tobacco: Not on file  . Alcohol Use: No    FAMILY HISTORY: No family history on file.  EXAM: BP 139/77 mmHg  Pulse 70  Temp(Src) 99 F (37.2 C) (Oral)  Resp 13  Ht 5\' 4"  (1.626 m)  Wt 220 lb (99.791 kg)  BMI 37.74 kg/m2  SpO2 98% CONSTITUTIONAL: Alert and oriented and responds appropriately to questions. Appears anxious, appears oriented and stated age HEAD: Normocephalic EYES: Conjunctivae clear, PERRL ENT: normal nose; no rhinorrhea; moist mucous membranes; pharynx without lesions noted NECK: Supple, no meningismus, no LAD  CARD: RRR; S1 and S2 appreciated; no murmurs, no clicks, no rubs, no gallops RESP: Normal chest excursion without splinting or tachypnea; breath sounds clear and equal bilaterally; no wheezes, no rhonchi, no rales, no hypoxia or respiratory distress, speaking full sentences ABD/GI: Normal bowel sounds; non-distended; soft, non-tender, no rebound, no guarding, no peritoneal signs BACK:  The back appears normal and is non-tender to palpation, there is no CVA tenderness EXT: Normal ROM in all joints; non-tender to palpation; patient has some edema in the left foot and ankle compared to the right, no pitting edema, no erythema or warmth; normal capillary refill; no cyanosis, no calf tenderness or swelling    SKIN: Normal color for age and race; warm NEURO: Moves all extremities equally, sensation to light touch intact diffusely, cranial nerves II through XII intact  PSYCH: The patient's mood and manner are appropriate. Grooming and personal hygiene are appropriate.  MEDICAL DECISION MAKING: Patient here with chest pain. She denies history of hypertension, diabetes or hyperlipidemia but states she does not see her doctor regularly. 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 Concern for ACS versus pulmonary embolus. We'll obtain cardiac labs, d-dimer, chest x-ray. We'll give aspirin nitroglycerin. Anticipate patient will need admission.   ED PROGRESS: 4:15 AM  Pt has had some relief with nitroglycerin but pain is completely gone. We'll give IV fentanyl. First troponin negative, d-dimer negative. Chest x-ray clear. We'll discuss with hospitalist for transfer and admission for chest pain rule out.   4:40 AM  D/w Dr. Hal Hope for admission to tele, obs.   EKG Interpretation  Date/Time:  Tuesday January 01 2015 03:12:55 EDT Ventricular Rate:  70 PR Interval:  134 QRS Duration: 80 QT Interval:  402 QTC Calculation: 434 R Axis:   -11 Text Interpretation:  Normal sinus rhythm Normal ECG No significant change since last tracing Confirmed by WARD,  DO, KRISTEN ([id number]) on 01/01/2015 3:25:18 AM        [place], DO 01/01/15 [id number]

## 2015-01-01 NOTE — ED Notes (Signed)
Pt c/o intermittent left side chest pain and tightness for the last week that radiates to the right side of her chest, radiates to her back in her shoulder blades, and has been worsening since last week.  Today, the pain was causing her to feel dizzy and weak and diaphoresis.  Pt states the pain gets better when she lies down with pillows under her back.

## 2015-01-01 NOTE — H&P (Signed)
[facility] History and Physical                                                                                    Isabella [name], is a 59 y.o. female  MRN: [id number]   DOB - 02-Sep-1955  Admit Date - 01/01/2015  Outpatient Primary MD for the patient is No primary care provider on file.  Referring MD: Ward / ER  Consulting M.D: [facility]  With History of -  Past Medical History  Diagnosis Date  . Cancer       Past Surgical History  Procedure Laterality Date  . Breast surgery      in for   Chief Complaint  Patient presents with  . Chest Pain     HPI This is a  59 year old female patient with past medical history of right-sided breast cancer in 2009 status post lumpectomy as well as chemotherapy and radiation therapy. In 2012 patient was having atypical chest pain but because of a positive family history of premature coronary artery disease she underwent a treadmill stress test that was negative. Presents today with left anterior chest pain ongoing for about 1 week with multiple episodes which have been increasing in frequency and duration. She initially described the pain as abrupt in onset, sharp like an ice pick stabbing into her left chest, which occurred with walking around in her home. She said it was significant enough that it "takes my breath". She also had episodes of similar discomfort at rest but these were more squeezing in nature and felt like she was being choked. These were associated with nausea and diaphoresis. She also had an episode in the past 24 hours of pain in her right shoulder that radiated down her arm into her left arm up into her neck into her back and into her left chest. With these episodes she was again nauseous weak and dizzy. The spells typically last about 30 minutes. At home she did not take anything to relieve the symptoms. She also describes since onset of the symptoms at night when she shuts her eyes it feels like her bed is spinning.  As noted the symptoms occur both with rest and with minimal exertion. Patient also reported that when her symptoms initially began that she looked them up on the Internet to see if the symptoms were something she could be concerned with (especially regarding atypical presentation of coronary disease in females given her family history). She reports as the week has progressed she has developed more of the symptoms. She denies any constitutional symptoms such as fevers chills cough abdominal pain nausea vomiting diarrhea, no blood in urine or stool. 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 Because of the above symptoms and due to her mother also having some acute health problems she has "taken herself off the truck".  The patient initially presented to [facility] ER. Initial troponin and EKG were negative. She was afebrile with a BP of 137/77, pulse 60 and regular respirations 13 and room air saturations 98%. Upper toward data otherwise  was unremarkable except for mild hyperglycemia glucose 124. Chest x-ray was unremarkable. D-dimer was negative. In the ER the patient was started on IV fluids she was given a total of 100 g of fentanyl for her chest pain as well as 3 sublingual nitroglycerin's and a full dose aspirin. Patient reported that this decreased her pain and at time of examination she was chest pain-free.   Review of Systems   In addition to the HPI above,  No Fever-chills, myalgias or other constitutional symptoms No Headache, changes with Vision or hearing, new weakness, tingling, numbness in any extremity, No problems swallowing food or Liquids, indigestion/reflux No Cough or Shortness of Breath, palpitations, orthopnea or DOE No Abdominal pain, N/V; no melena or hematochezia, no dark tarry stools No dysuria, hematuria or flank pain No new skin rashes, lesions, masses or bruises, No new joints  pains-aches No recent weight gain or loss No polyuria, polydypsia or polyphagia,  *A full 10 point Review of Systems was done, except as stated above, all other Review of Systems were negative.  Social History Social History  Substance Use Topics  . Smoking status: Never Smoker   . Smokeless tobacco: Not on file  . Alcohol Use: No    Resides at: Private residence  Lives with: Significant other  Ambulatory status: Without assistive devices   Family History Patient reports grandmother as well as sister with CAD; sister onset of symptoms prior to age 15   Prior to Admission medications   Medication Sig Start Date End Date Taking? Authorizing Provider  azithromycin (ZITHROMAX) 250 MG tablet One tablet daily for 4 additional days. 09/26/13   Evalee Jefferson, PA-C  benzonatate (TESSALON) 200 MG capsule Take 1 capsule (200 mg total) by mouth 3 (three) times daily as needed for cough. 09/25/13   Evalee Jefferson, PA-C    No Known Allergies  Physical Exam  Vitals  Blood pressure 125/70, pulse 67, temperature 97.7 F (36.5 C), temperature source Oral, resp. rate 18, height 5\' 4"  (1.626 m), weight 226 lb 6.4 oz (102.694 kg), SpO2 98 %.   General:  In no acute distress, appears healthy and well nourished  Psych:  Normal affect although very anxious,  repeating previous history discussed and this appears related to her level of anxiety, Denies Suicidal or Homicidal ideations, Awake Alert, Oriented X 3. Speech and thought patterns are clear and appropriate-3 talkative, no apparent short term memory deficits  Neuro:   No focal neurological deficits, CN II through XII intact, Strength 5/5 all 4 extremities, Sensation intact all 4 extremities.  ENT:  Ears and Eyes appear Normal, Conjunctivae clear, PER. Moist oral mucosa without erythema or exudates.  Neck:  Supple, No lymphadenopathy appreciated  Respiratory:  Symmetrical chest wall movement, Good air movement bilaterally, CTAB. Room  Air  Cardiac:  RRR, No Murmurs, no LE edema noted, no JVD, No carotid bruits, peripheral pulses palpable at 2+  Abdomen:  Positive bowel sounds, Soft, Non tender, Non distended,  No masses appreciated, no obvious hepatosplenomegaly  Skin:  No Cyanosis, Normal Skin Turgor, No Skin Rash or Bruise.  Extremities: Symmetrical without obvious trauma or injury,  no effusions.  Data Review  CBC  Recent Labs Lab 01/01/15 0330  WBC 7.9  HGB 13.2  HCT 40.0  PLT 227  MCV 88.3  MCH 29.1  MCHC 33.0  RDW 12.3    Chemistries   Recent Labs Lab 01/01/15 0330  NA 141  K 3.8  CL 105  CO2 28  GLUCOSE  124*  BUN 16  CREATININE 0.80  CALCIUM 8.8*    estimated creatinine clearance is 88.3 mL/min (by C-G formula based on Cr of 0.8).  No results for input(s): TSH, T4TOTAL, T3FREE, THYROIDAB in the last 72 hours.  Invalid input(s): FREET3  Coagulation profile No results for input(s): INR, PROTIME in the last 168 hours.   Recent Labs  01/01/15 0330  DDIMER <0.27    Cardiac Enzymes  Recent Labs Lab 01/01/15 0330  TROPONINI <0.03    Invalid input(s): POCBNP  Urinalysis    Component Value Date/Time   LABSPEC 1.030 10/24/2007 1044   PHURINE 6.0 10/24/2007 1044   HGBUR Trace 10/24/2007 1044   BILIRUBINUR Negative 10/24/2007 1044   KETONESUR Negative 10/24/2007 1044   PROTEINUR Negative 10/24/2007 1044   NITRITE Negative 10/24/2007 1044   LEUKOCYTESUR Moderate 10/24/2007 1044    Imaging results:   Dg Chest 2 View  01/01/2015   CLINICAL DATA:  Intermittent LEFT chest pain and tightness for last week radiating to RIGHT and to back, worsening, today associated with dizziness, weakness and diaphoresis  EXAM: CHEST  2 VIEW  COMPARISON:  05/25/2009  FINDINGS: Normal heart size, mediastinal contours, and pulmonary vascularity.  Lungs clear.  No pneumothorax.  Bones unremarkable.  IMPRESSION: Normal exam.   Electronically Signed   By: Lavonia Dana M.D.   On: 01/01/2015 04:22      EKG: (Independently reviewed) Sinus rhythm with a ventricular rate of 80 bpm, QTC 434 ms, no ST segment or T-wave changes concerning for ischemia    Assessment & Plan  Principal Problem:   Chest pain on exertion -Admit to telemetry/observational status -Cardiology consulted -Cycle troponin and check echocardiogram -Begin full dose aspirin and low-dose beta blocker -Symptoms associated with rest and with exertion and have both typical and atypical features noting currently without chest pain but still having issues with significant neck pain -Likely will need to undergo stress Myoview in a.m. given family history and patient's reported symptoms -She reported was given aspirin as well as 3 sublingual nitroglycerin after arrival to ER with decrease in pain and currently is pain-free -Check lipid panel  Active Problems:   Anxiety -Unclear pre-existing anxiety or current anxiety more related to symptoms she is experiencing -Have added when necessary Xanax and Ambien for sleep    Acute hyperglycemia -Likely related to elevated cortisol secondary to acute pain and anxiety as well as lack of sleep last night -Check hemoglobin A1c    DVT Prophylaxis:  Family Communication:     Code Status:    Condition:    Discharge disposition:  Time spent in minutes : 60      Alvena Kiernan L. ANP on 01/01/2015 at 7:51 AM  Between 7am to 7pm - Pager - [phone]  After 7pm go to [email or web address] - password [email or web address]  And look for the night coverage person covering me after hours  [facility]

## 2015-01-02 ENCOUNTER — Observation Stay (HOSPITAL_COMMUNITY): Payer: Medicaid Other

## 2015-01-02 DIAGNOSIS — R079 Chest pain, unspecified: Secondary | ICD-10-CM

## 2015-01-02 DIAGNOSIS — R7309 Other abnormal glucose: Secondary | ICD-10-CM

## 2015-01-02 LAB — NM MYOCAR MULTI W/SPECT W/WALL MOTION / EF
CHL CUP NUCLEAR SRS: 5
CHL CUP NUCLEAR SSS: 10
CSEPHR: 57 %
CSEPPHR: 93 {beats}/min
Estimated workload: 1 METS
LHR: 0
LV dias vol: 70 mL
LVSYSVOL: 42 mL
MPHR: 161 {beats}/min
Rest HR: 54 {beats}/min
SDS: 5
TID: 1.67

## 2015-01-02 LAB — LIPID PANEL
CHOLESTEROL: 220 mg/dL — AB (ref 0–200)
HDL: 37 mg/dL — ABNORMAL LOW (ref >40)
LDL Cholesterol: 156 mg/dL — ABNORMAL HIGH (ref 0–99)
TRIGLYCERIDES: 137 mg/dL (ref <150)
Total CHOL/HDL Ratio: 5.9 RATIO
VLDL: 27 mg/dL (ref 0–40)

## 2015-01-02 LAB — HEMOGLOBIN A1C
HEMOGLOBIN A1C: 6.2 % — AB (ref 4.8–5.6)
MEAN PLASMA GLUCOSE: 131 mg/dL

## 2015-01-02 MED ORDER — REGADENOSON 0.4 MG/5ML IV SOLN
0.4000 mg | Freq: Once | INTRAVENOUS | Status: AC
  Administered 2015-01-02: 0.4 mg via INTRAVENOUS
  Filled 2015-01-02: qty 5

## 2015-01-02 MED ORDER — TECHNETIUM TC 99M SESTAMIBI GENERIC - CARDIOLITE
10.0000 | Freq: Once | INTRAVENOUS | Status: AC | PRN
  Administered 2015-01-02: 10 via INTRAVENOUS

## 2015-01-02 MED ORDER — TECHNETIUM TC 99M SESTAMIBI GENERIC - CARDIOLITE
30.0000 | Freq: Once | INTRAVENOUS | Status: AC | PRN
  Administered 2015-01-02: 30 via INTRAVENOUS

## 2015-01-02 MED ORDER — NITROGLYCERIN 0.4 MG SL SUBL
0.4000 mg | SUBLINGUAL_TABLET | Freq: Once | SUBLINGUAL | Status: DC
  Filled 2015-01-02: qty 1

## 2015-01-02 MED ORDER — METOPROLOL TARTRATE 1 MG/ML IV SOLN
5.0000 mg | Freq: Once | INTRAVENOUS | Status: DC
  Filled 2015-01-02: qty 5

## 2015-01-02 MED ORDER — REGADENOSON 0.4 MG/5ML IV SOLN
INTRAVENOUS | Status: AC
  Filled 2015-01-02: qty 5

## 2015-01-02 NOTE — Progress Notes (Signed)
Patient discharge to home accompanied by NT and family members. Discharge instructions given. Patient verbalizes understanding/ All personal belongings given. Denies any pain. Patient's stable. No apparent distress noted.

## 2015-01-02 NOTE — Discharge Summary (Signed)
Physician Discharge Summary  Isabella [name] QJF:[id number] DOB: 1956/04/11 DOA: 01/01/2015  PCP: No primary care Isabella [name] on file.  Admit date: 01/01/2015 Discharge date: 01/02/2015  Recommendations for Outpatient Follow-up:  1. Noncardiac chest pain.  2. Random hyperglycemia of unclear clinical significance. Hemoglobin A1c 6.2.   Discharge Diagnoses:  1. Atypical chest pain 2. Hyperglycemia   Discharge Condition: improved Disposition: home  Diet recommendation: regular  Filed Weights   01/01/15 0317 01/01/15 0607 01/02/15 0435  Weight: 99.791 kg (220 lb) 102.694 kg (226 lb 6.4 oz) 101.424 kg (223 lb 9.6 oz)    History of present illness:  59 year old woman with history of breast cancer presented with left-sided chest pain with associated shortness of breath. Initial evaluation was unremarkable, admitted for further evaluation.   Hospital Course:  Troponins negative, EKG nonacute, d-dimer negative. Echocardiogram reassuring. Seen by cardiology with recommendations for nuclear stress testing which was indeterminate as below. After further discussion with cardiology, coronary CT was recommended which was reassuring, cardiology has recommended discharge home with no further evaluation. Reports pain present for 10 days. Suggest outpatient noncardiac chest pain evaluation.  1. Chest pain, atypical, constant >48 hours with negative troponins, nonacute EKG, no evidence of ACS. Echo reassuring, ddimer negative, no SOB. Nuclear stress indeterminate but coronary CT was reassuring. Does report indigestion/heart burn. 2. Hyperglycemia with modest elevation of HgbA1c. Stable. F/u as an outpatient. 3. Right breast cancer 2009 4. Obesity  Consultants:  Cardiology  Procedures:  Echo Study Conclusions  - Left ventricle: Global Longitudinal LV strain is normal at -21% The cavity size was normal. Systolic function was normal. The estimated ejection fraction was in the range of 60% to  65%. Wall motion was normal; there were no regional wall motion abnormalities. - Aortic valve: Trileaflet; normal thickness, mildly calcified leaflets. - Mitral valve: There was trivial regurgitation.  Discharge Instructions  Discharge Instructions    Activity as tolerated - No restrictions    Complete by:  As directed      Diet general    Complete by:  As directed      Discharge instructions    Complete by:  As directed   Call your physician or seek immediate medical attention for chest pain, shortness of breath, dizziness or worsening of condition. Be sure to follow-up with PCP as needed if pain recurs or worsens.          There are no discharge medications for this patient.  No Known Allergies  The results of significant diagnostics from this hospitalization (including imaging, microbiology, ancillary and laboratory) are listed below for reference.    Significant Diagnostic Studies: Dg Chest 2 View  01/01/2015   CLINICAL DATA:  Intermittent LEFT chest pain and tightness for last week radiating to RIGHT and to back, worsening, today associated with dizziness, weakness and diaphoresis  EXAM: CHEST  2 VIEW  COMPARISON:  05/25/2009  FINDINGS: Normal heart size, mediastinal contours, and pulmonary vascularity.  Lungs clear.  No pneumothorax.  Bones unremarkable.  IMPRESSION: Normal exam.   Electronically Signed   By: Lavonia Dana M.D.   On: 01/01/2015 04:22   Nm Myocar Multi W/spect W/wall Motion / Ef  01/02/2015    There was no ST segment deviation noted during stress.  Defect 1: There is a medium defect of moderate severity.  Findings consistent with prior myocardial infarction with peri-infarct  ischemia.  This is an intermediate risk study.  Nuclear stress EF: 41%.   Moderate size and intensity, partially reversible  basal to mid-septal  defect which likely represents artifact, however, scar with peri-infarct  ischemia is also possible. LVEF 41% with septal akinesis. This is  an  intermediate risk study. Clinical correlation is advised.   Ct Coronary Morp W/cta Cor W/score W/ca W/cm &/or Wo/cm  01/02/2015   ADDENDUM REPORT: 01/02/2015 18:02 CLINICAL DATA:  Chest pain EXAM: Cardiac CTA MEDICATIONS: Sub lingual nitro. 4mg  and lopressor 5mg  TECHNIQUE: The patient was scanned on a Philips 696 slice scanner. Gantry rotation speed was 270 msecs. Collimation was .57mm. A 100 kV prospective scan was triggered in the descending thoracic aorta at 111 HU's with 5% padding centered around 78% of the R-R interval. Average HR during the scan was 58 bpm. The 3D data set was interpreted on a dedicated work station using MPR, MIP and VRT modes. A total of 80cc of contrast was used. FINDINGS: Non-cardiac: See separate report from [facility]. No significant findings on limited lung and soft tissue windows. Calcium Score: Coronary Arteries: Right dominant with no anomalies LM: Normal LAD:  Less than 30% calcific disease in proximal and mid vessel IM:  Small no disease D1:  Large and normal Circumflex: Normal OM1:  Normal RCA: Dominant less than 30% calcific disease in the proximal and mid vessel PDA: Normal PLA:  Normal IMPRESSION: 1) Right dominant coronary arteries. Non obstructive less than 30% disease in proxima/mid RCA and proximal/mid LAD 2) Calcium score 76 which is 90th percentile for age and sex matched controls 3) Normal Aorta Ascending Root 3.4 cm 4) No significant non cardiac findings see Radiology addendum [place] Rouge Electronically Signed   By: Jenkins Rouge M.D.   On: 01/02/2015 18:02  01/02/2015   EXAM: OVER-READ INTERPRETATION  CT CHEST  The following report is an over-read performed by radiologist Dr. Julian Hy of [facility], [place] on 01/02/2015. This over-read does not include interpretation of cardiac or coronary anatomy or pathology. The coronary CTA interpretation by the cardiologist is attached.  COMPARISON:  CT chest dated 12/07/2006  FINDINGS: 5 mm  subpleural nodule in the posterior right lower lobe (series 204/ image 39), unchanged since 2008, benign. No suspicious pulmonary nodules.  No focal consolidation.  No pleural effusion or pneumothorax.  Small mediastinal and left axillary nodes which do not meet pathologic CT size criteria.  Visualized upper abdomen is notable for mild hepatic steatosis.  No focal osseous lesions.  IMPRESSION: 5 mm posterior right lower lobe nodule, unchanged since 2008, benign. No suspicious pulmonary nodules.  No significant extracardiac findings.  Electronically Signed: By: Julian Hy M.D. On: 01/02/2015 16:44      Labs: Basic Metabolic Panel:  Recent Labs Lab 01/01/15 0330  NA 141  K 3.8  CL 105  CO2 28  GLUCOSE 124*  BUN 16  CREATININE 0.80  CALCIUM 8.8*   CBC:  Recent Labs Lab 01/01/15 0330  WBC 7.9  HGB 13.2  HCT 40.0  MCV 88.3  PLT 227   Cardiac Enzymes:  Recent Labs Lab 01/01/15 0330 01/01/15 0855 01/01/15 1045 01/01/15 1425  TROPONINI <0.03 <0.03 <0.03 <0.03      Principal Problem:   Chest pain on exertion Active Problems:   Anxiety   Acute hyperglycemia   Pain in the chest   Time coordinating discharge: 35 minutes  Signed:  [name], MD [facility] 01/02/2015, 6:43 PM

## 2015-01-02 NOTE — Progress Notes (Addendum)
PROGRESS NOTE  [name] [id number] DOB: July 30, 1955 DOA: 01/01/2015 PCP: No primary care provider on file.  Summary: 59 year old woman with history of breast cancer presented with left-sided chest pain with associated shortness of breath. Initial evaluation is unremarkable, admitted for further evaluation. Troponins negative, EKG nonacute, d-dimer negative. Echocardiogram reassuring. Seen by cardiology with recommendations for nuclear stress testing.  Assessment/Plan: 1. Chest pain, atypical, constant >48 hours with negative troponins, nonacute EKG, no evidence of ACS. Echo reassuring, ddimer negative, no SOB. Nuclear stress pending. Does report indigestion/heart burn. 2. Hyperglycemia with modest elevation of HgbA1c. Stable. F/u as an outpatient. 3. Right breast cancer 2009 4. Obesity    Atypical, noncardiac chest pain with negative workup.Follow-up nuclear stress testing.   She has had pain for more than 10 days. Favor noncardiac etiology, recommend outpatient evaluation for GERD.  We will check orthostatics but she has no evidence of significant gait imbalance.  Would anticipate discharge later today.  ADDENDUM Nuclear test was indeterminate. I discussed the case with Dr. [name] who recommends cardiac CT which formed 9/22. This is unremarkable no further evaluation would be suggested.  Code Status: full code DVT prophylaxis: Lovenox Family Communication: none present, alert and understands plan Disposition Plan: home  Isabella Hodgkins, MD  [facility]  Pager [phone] If 7PM-7AM, please contact night-coverage at [email or web address], password [facility] 01/02/2015, 8:34 AM    Consultants:  Cardiology   Procedures:  Echo Study Conclusions  - Left ventricle: Global Longitudinal LV strain is normal at -21% The cavity size was normal. Systolic function was normal. The estimated ejection fraction was in the range of 60% to 65%. Wall motion was normal; there were  no regional wall motion abnormalities. - Aortic valve: Trileaflet; normal thickness, mildly calcified leaflets. - Mitral valve: There was trivial regurgitation.  Antibiotics:    HPI/Subjective: Feels about the same. Constant chest pain since before admission (10 days) only helped by morphine. No SOB. No abd pain. No vomiting today, had nausea and sweats last night. Dizzy on feet, lightheaded.   Objective: Filed Vitals:   01/01/15 2341 01/02/15 0016 01/02/15 0435 01/02/15 0746  BP: 143/79 130/65 123/67 119/64  Pulse: 59 54 61 62  Temp: 97.5 F (36.4 C) 97.4 F (36.3 C) 97.3 F (36.3 C) 97.9 F (36.6 C)  TempSrc: Oral Oral Oral Oral  Resp: 20 19 18 20   Height:      Weight:   101.424 kg (223 lb 9.6 oz)   SpO2: 98% 97% 97% 97%    Intake/Output Summary (Last 24 hours) at 01/02/15 0834 Last data filed at 01/01/15 2238  Gross per 24 hour  Intake    480 ml  Output   1950 ml  Net  -1470 ml     Filed Weights   01/01/15 0317 01/01/15 0607 01/02/15 0435  Weight: 99.791 kg (220 lb) 102.694 kg (226 lb 6.4 oz) 101.424 kg (223 lb 9.6 oz)    Exam:     Afebrile, vital signs stable, no hypoxia General:  Appears calm and comfortable Cardiovascular: RRR, no m/r/g. No LE edema. Telemetry: ST, no arrhythmias  Respiratory: CTA bilaterally, no w/r/r. Normal respiratory effort. Abdomen: soft, ntnd Psychiatric: grossly normal mood and affect, speech fluent and appropriate Stands up briskly without assistance. Walks without assistance with normal gait, turns easily, no evidence of gait and balance. Neurologic appears intact.  New data reviewed:  LDL 156, total 220  Pertinent data since admission:  CXR normal  troponins negative   Random glucose 123  ddimer negative  Hgb A1c 6.2  EKG SR, no acute changes  Pending data:    Scheduled Meds: . aspirin EC  325 mg Oral Daily  . enoxaparin (LOVENOX) injection  40 mg Subcutaneous Q24H  . metoprolol tartrate  25 mg  Oral BID   Continuous Infusions:   Principal Problem:   Chest pain on exertion Active Problems:   Anxiety   Acute hyperglycemia   Time spent 20 minutes

## 2015-01-02 NOTE — Progress Notes (Signed)
Patient Name: Isabella [name] Date of Encounter: 01/02/2015     Principal Problem:   Chest pain on exertion Active Problems:   Anxiety   Acute hyperglycemia    SUBJECTIVE  Seen in nuclear medicine for stress test. She tolerated the procedure well. Had some more chest pain last night associated with n/v. No chest pain now .  CURRENT MEDS . aspirin EC  325 mg Oral Daily  . enoxaparin (LOVENOX) injection  40 mg Subcutaneous Q24H  . metoprolol tartrate  25 mg Oral BID  . regadenoson      . regadenoson  0.4 mg Intravenous Once    OBJECTIVE  Filed Vitals:   01/02/15 0016 01/02/15 0435 01/02/15 0746 01/02/15 0900  BP: 130/65 123/67 119/64 124/74  Pulse: 54 61 62 50  Temp: 97.4 F (36.3 C) 97.3 F (36.3 C) 97.9 F (36.6 C)   TempSrc: Oral Oral Oral   Resp: 19 18 20    Height:      Weight:  223 lb 9.6 oz (101.424 kg)    SpO2: 97% 97% 97%     Intake/Output Summary (Last 24 hours) at 01/02/15 0939 Last data filed at 01/01/15 2238  Gross per 24 hour  Intake    480 ml  Output   1950 ml  Net  -1470 ml   Filed Weights   01/01/15 0317 01/01/15 0607 01/02/15 0435  Weight: 220 lb (99.791 kg) 226 lb 6.4 oz (102.694 kg) 223 lb 9.6 oz (101.424 kg)    PHYSICAL EXAM  General: Pleasant, NAD. obese Neuro: Alert and oriented X 3. Moves all extremities spontaneously. Psych: Normal affect. HEENT:  Normal  Neck: Supple without bruits or JVD. Lungs:  Resp regular and unlabored, CTA. Heart: RRR no s3, s4, or murmurs. Abdomen: Soft, non-tender, non-distended, BS + x 4.  Extremities: No clubbing, cyanosis or edema. DP/PT/Radials 2+ and equal bilaterally.  Accessory Clinical Findings  CBC  Recent Labs  01/01/15 0330  WBC 7.9  HGB 13.2  HCT 40.0  MCV 88.3  PLT 786   Basic Metabolic Panel  Recent Labs  01/01/15 0330  NA 141  K 3.8  CL 105  CO2 28  GLUCOSE 124*  BUN 16  CREATININE 0.80  CALCIUM 8.8*   Liver Function Tests No results for input(s): AST, ALT,  ALKPHOS, BILITOT, PROT, ALBUMIN in the last 72 hours. No results for input(s): LIPASE, AMYLASE in the last 72 hours. Cardiac Enzymes  Recent Labs  01/01/15 0855 01/01/15 1045 01/01/15 1425  TROPONINI <0.03 <0.03 <0.03   BNP Invalid input(s): POCBNP D-Dimer  Recent Labs  01/01/15 0330  DDIMER <0.27   Hemoglobin A1C  Recent Labs  01/01/15 0855  HGBA1C 6.2*   Fasting Lipid Panel  Recent Labs  01/02/15 0320  CHOL 220*  HDL 37*  LDLCALC 156*  TRIG 137  CHOLHDL 5.9   Thyroid Function Tests No results for input(s): TSH, T4TOTAL, T3FREE, THYROIDAB in the last 72 hours.  Invalid input(s): FREET3  TELE  NSR  Radiology/Studies  Dg Chest 2 View  01/01/2015   CLINICAL DATA:  Intermittent LEFT chest pain and tightness for last week radiating to RIGHT and to back, worsening, today associated with dizziness, weakness and diaphoresis  EXAM: CHEST  2 VIEW  COMPARISON:  05/25/2009  FINDINGS: Normal heart size, mediastinal contours, and pulmonary vascularity.  Lungs clear.  No pneumothorax.  Bones unremarkable.  IMPRESSION: Normal exam.   Electronically Signed   By: Lavonia Dana M.D.   On:  01/01/2015 04:22    ASSESSMENT AND PLAN  Isabella [name] is a 59 y.o. female with a history of breast cancer s/p lumpectomy/chemo/XRT (2009) and obesity who presented to [facility] on 01/01/15 with chest pain.  Chest pain- felt to be atypical  -- Troponin neg x2. ECG with no acute ST or TW changes -- D Dimer negative. -- Undergoing lexiscan myoview today. Pending nuclear images. She may be able to be discharged if stress test low risk.   HLD- LDL 156. Consider adding a statin   Pre-diabetes- HgA1c 6.2. Counseled on diet and exercise.     Mable Fill R PA-C  Pager [phone]  I have seen and examined the patient along with Angelena Form R PA-C.  I have reviewed the chart, notes and new data.  I agree with PA's note.   PLAN: I reviewed the nuclear perfusion images and I  think the study is normal. The reported EF was 41%, but I suspect this a gating-related artifact. Normal LVEF by echo. Formal report to follow, but I think she will be discharged today without need for additional cardiac workup.  Sanda Klein, MD, [place] [phone] 01/02/2015, 12:14 PM

## 2015-01-04 MED ORDER — IOHEXOL 350 MG/ML SOLN
100.0000 mL | Freq: Once | INTRAVENOUS | Status: AC | PRN
  Administered 2015-01-02: 100 mL via INTRAVENOUS

## 2015-02-11 ENCOUNTER — Emergency Department (HOSPITAL_COMMUNITY): Payer: Medicaid Other

## 2015-02-11 ENCOUNTER — Encounter (HOSPITAL_COMMUNITY): Payer: Self-pay

## 2015-02-11 ENCOUNTER — Inpatient Hospital Stay (HOSPITAL_COMMUNITY)
Admission: EM | Admit: 2015-02-11 | Discharge: 2015-02-16 | DRG: 481 | Disposition: A | Discharge status: Home Health | Payer: Medicaid Other | Attending: Orthopedic Surgery | Admitting: Orthopedic Surgery

## 2015-02-11 DIAGNOSIS — W1789XA Other fall from one level to another, initial encounter: Secondary | ICD-10-CM | POA: Diagnosis present

## 2015-02-11 DIAGNOSIS — E669 Obesity, unspecified: Secondary | ICD-10-CM

## 2015-02-11 DIAGNOSIS — D62 Acute posthemorrhagic anemia: Secondary | ICD-10-CM | POA: Diagnosis not present

## 2015-02-11 DIAGNOSIS — M79605 Pain in left leg: Secondary | ICD-10-CM | POA: Diagnosis present

## 2015-02-11 DIAGNOSIS — E559 Vitamin D deficiency, unspecified: Secondary | ICD-10-CM | POA: Diagnosis present

## 2015-02-11 DIAGNOSIS — S72452A Displaced supracondylar fracture without intracondylar extension of lower end of left femur, initial encounter for closed fracture: Principal | ICD-10-CM | POA: Diagnosis present

## 2015-02-11 DIAGNOSIS — S7292XA Unspecified fracture of left femur, initial encounter for closed fracture: Secondary | ICD-10-CM | POA: Diagnosis present

## 2015-02-11 DIAGNOSIS — S7290XA Unspecified fracture of unspecified femur, initial encounter for closed fracture: Secondary | ICD-10-CM | POA: Diagnosis present

## 2015-02-11 DIAGNOSIS — E1169 Type 2 diabetes mellitus with other specified complication: Secondary | ICD-10-CM

## 2015-02-11 DIAGNOSIS — Z419 Encounter for procedure for purposes other than remedying health state, unspecified: Secondary | ICD-10-CM

## 2015-02-11 DIAGNOSIS — E1165 Type 2 diabetes mellitus with hyperglycemia: Secondary | ICD-10-CM | POA: Diagnosis present

## 2015-02-11 DIAGNOSIS — S72453A Displaced supracondylar fracture without intracondylar extension of lower end of unspecified femur, initial encounter for closed fracture: Secondary | ICD-10-CM | POA: Diagnosis present

## 2015-02-11 DIAGNOSIS — Z853 Personal history of malignant neoplasm of breast: Secondary | ICD-10-CM | POA: Diagnosis not present

## 2015-02-11 DIAGNOSIS — W19XXXA Unspecified fall, initial encounter: Secondary | ICD-10-CM

## 2015-02-11 HISTORY — DX: Vitamin D deficiency, unspecified: E55.9

## 2015-02-11 HISTORY — DX: Displaced supracondylar fracture without intracondylar extension of lower end of unspecified femur, initial encounter for closed fracture: S72.453A

## 2015-02-11 HISTORY — DX: Malignant neoplasm of unspecified site of unspecified female breast: C50.919

## 2015-02-11 LAB — CBC WITH DIFFERENTIAL/PLATELET
Basophils Absolute: 0 10*3/uL (ref 0.0–0.1)
Basophils Relative: 0 %
EOS ABS: 0 10*3/uL (ref 0.0–0.7)
EOS PCT: 0 %
HCT: 39.3 % (ref 36.0–46.0)
HEMOGLOBIN: 13 g/dL (ref 12.0–15.0)
LYMPHS ABS: 1 10*3/uL (ref 0.7–4.0)
Lymphocytes Relative: 9 %
MCH: 29.2 pg (ref 26.0–34.0)
MCHC: 33.1 g/dL (ref 30.0–36.0)
MCV: 88.3 fL (ref 78.0–100.0)
MONO ABS: 0.5 10*3/uL (ref 0.1–1.0)
MONOS PCT: 4 %
NEUTROS PCT: 87 %
Neutro Abs: 10.5 10*3/uL — ABNORMAL HIGH (ref 1.7–7.7)
Platelets: 215 10*3/uL (ref 150–400)
RBC: 4.45 MIL/uL (ref 3.87–5.11)
RDW: 12.8 % (ref 11.5–15.5)
WBC: 12.1 10*3/uL — ABNORMAL HIGH (ref 4.0–10.5)

## 2015-02-11 LAB — BASIC METABOLIC PANEL
Anion gap: 11 (ref 5–15)
BUN: 11 mg/dL (ref 6–20)
CHLORIDE: 100 mmol/L — AB (ref 101–111)
CO2: 24 mmol/L (ref 22–32)
CREATININE: 0.97 mg/dL (ref 0.44–1.00)
Calcium: 8.4 mg/dL — ABNORMAL LOW (ref 8.9–10.3)
GFR calc Af Amer: >60 mL/min (ref >60)
GFR calc non Af Amer: >60 mL/min (ref >60)
Glucose, Bld: 194 mg/dL — ABNORMAL HIGH (ref 65–99)
Potassium: 3.7 mmol/L (ref 3.5–5.1)
SODIUM: 135 mmol/L (ref 135–145)

## 2015-02-11 MED ORDER — HYDROMORPHONE HCL 1 MG/ML IJ SOLN
1.0000 mg | INTRAMUSCULAR | Status: DC | PRN
  Administered 2015-02-12 (×2): 1 mg via INTRAVENOUS
  Filled 2015-02-11 (×3): qty 1

## 2015-02-11 MED ORDER — HYDROMORPHONE HCL 1 MG/ML IJ SOLN
1.0000 mg | Freq: Once | INTRAMUSCULAR | Status: AC
  Administered 2015-02-11: 1 mg via INTRAVENOUS
  Filled 2015-02-11: qty 1

## 2015-02-11 MED ORDER — ONDANSETRON HCL 4 MG/2ML IJ SOLN
4.0000 mg | Freq: Three times a day (TID) | INTRAMUSCULAR | Status: DC | PRN
  Administered 2015-02-12 (×2): 4 mg via INTRAVENOUS
  Filled 2015-02-11 (×2): qty 2

## 2015-02-11 NOTE — ED Notes (Signed)
Per EMS: Pt sitting on back of pick-up truck, went to step off of truck and leg slid out from under her. EMS describes obvious deformity to L thigh. Placed patient into traction. Pt complains of significant L thigh pain. Pt a/o x 4, EMS gave 160mcg fentanyl. 136/100, 78 bpm, 100% RA.

## 2015-02-12 ENCOUNTER — Inpatient Hospital Stay (HOSPITAL_COMMUNITY): Payer: Medicaid Other

## 2015-02-12 ENCOUNTER — Inpatient Hospital Stay (HOSPITAL_COMMUNITY): Payer: Medicaid Other | Admitting: Certified Registered Nurse Anesthetist

## 2015-02-12 ENCOUNTER — Encounter (HOSPITAL_COMMUNITY)
Admission: EM | Disposition: A | Discharge status: Home Health | Payer: Self-pay | Source: Home / Self Care | Attending: Orthopedic Surgery

## 2015-02-12 ENCOUNTER — Other Ambulatory Visit: Payer: Self-pay | Admitting: Orthopedic Surgery

## 2015-02-12 ENCOUNTER — Encounter (HOSPITAL_COMMUNITY): Payer: Self-pay | Admitting: *Deleted

## 2015-02-12 DIAGNOSIS — S7290XA Unspecified fracture of unspecified femur, initial encounter for closed fracture: Secondary | ICD-10-CM | POA: Diagnosis present

## 2015-02-12 DIAGNOSIS — S72453A Displaced supracondylar fracture without intracondylar extension of lower end of unspecified femur, initial encounter for closed fracture: Secondary | ICD-10-CM

## 2015-02-12 HISTORY — PX: FEMUR IM NAIL: SHX1597

## 2015-02-12 HISTORY — DX: Displaced supracondylar fracture without intracondylar extension of lower end of unspecified femur, initial encounter for closed fracture: S72.453A

## 2015-02-12 LAB — CBC WITH DIFFERENTIAL/PLATELET
BASOS ABS: 0 10*3/uL (ref 0.0–0.1)
Basophils Relative: 0 %
EOS PCT: 1 %
Eosinophils Absolute: 0.1 10*3/uL (ref 0.0–0.7)
HEMATOCRIT: 36.8 % (ref 36.0–46.0)
Hemoglobin: 12.1 g/dL (ref 12.0–15.0)
LYMPHS ABS: 1.5 10*3/uL (ref 0.7–4.0)
LYMPHS PCT: 16 %
MCH: 29.1 pg (ref 26.0–34.0)
MCHC: 32.9 g/dL (ref 30.0–36.0)
MCV: 88.5 fL (ref 78.0–100.0)
MONO ABS: 0.6 10*3/uL (ref 0.1–1.0)
MONOS PCT: 7 %
NEUTROS ABS: 7.3 10*3/uL (ref 1.7–7.7)
Neutrophils Relative %: 76 %
Platelets: 215 10*3/uL (ref 150–400)
RBC: 4.16 MIL/uL (ref 3.87–5.11)
RDW: 13 % (ref 11.5–15.5)
WBC: 9.5 10*3/uL (ref 4.0–10.5)

## 2015-02-12 LAB — APTT: aPTT: 27 seconds (ref 24–37)

## 2015-02-12 LAB — PREALBUMIN: Prealbumin: 20 mg/dL (ref 18–38)

## 2015-02-12 LAB — TRANSFERRIN: TRANSFERRIN: 207 mg/dL (ref 192–382)

## 2015-02-12 LAB — COMPREHENSIVE METABOLIC PANEL
ALBUMIN: 3.3 g/dL — AB (ref 3.5–5.0)
ALT: 26 U/L (ref 14–54)
ANION GAP: 7 (ref 5–15)
AST: 26 U/L (ref 15–41)
Alkaline Phosphatase: 90 U/L (ref 38–126)
BILIRUBIN TOTAL: 0.3 mg/dL (ref 0.3–1.2)
BUN: 10 mg/dL (ref 6–20)
CO2: 30 mmol/L (ref 22–32)
Calcium: 8.6 mg/dL — ABNORMAL LOW (ref 8.9–10.3)
Chloride: 103 mmol/L (ref 101–111)
Creatinine, Ser: 0.85 mg/dL (ref 0.44–1.00)
GFR calc Af Amer: >60 mL/min (ref >60)
GFR calc non Af Amer: >60 mL/min (ref >60)
GLUCOSE: 152 mg/dL — AB (ref 65–99)
POTASSIUM: 3.6 mmol/L (ref 3.5–5.1)
SODIUM: 140 mmol/L (ref 135–145)
TOTAL PROTEIN: 5.4 g/dL — AB (ref 6.5–8.1)

## 2015-02-12 LAB — PHOSPHORUS: PHOSPHORUS: 3.6 mg/dL (ref 2.5–4.6)

## 2015-02-12 LAB — SURGICAL PCR SCREEN
MRSA, PCR: NEGATIVE
STAPHYLOCOCCUS AUREUS: NEGATIVE

## 2015-02-12 LAB — MAGNESIUM: MAGNESIUM: 1.7 mg/dL (ref 1.7–2.4)

## 2015-02-12 LAB — TSH: TSH: 0.912 u[IU]/mL (ref 0.350–4.500)

## 2015-02-12 LAB — PROTIME-INR
INR: 1.05 (ref 0.00–1.49)
PROTHROMBIN TIME: 13.9 s (ref 11.6–15.2)

## 2015-02-12 LAB — ABO/RH: ABO/RH(D): B POS

## 2015-02-12 LAB — TYPE AND SCREEN
ABO/RH(D): B POS
Antibody Screen: NEGATIVE

## 2015-02-12 SURGERY — INSERTION, INTRAMEDULLARY ROD, FEMUR, RETROGRADE
Anesthesia: General | Site: Leg Upper | Laterality: Left

## 2015-02-12 MED ORDER — HYDROMORPHONE HCL 1 MG/ML IJ SOLN
INTRAMUSCULAR | Status: AC
  Filled 2015-02-12: qty 1

## 2015-02-12 MED ORDER — MIDAZOLAM HCL 2 MG/2ML IJ SOLN
INTRAMUSCULAR | Status: AC
  Filled 2015-02-12: qty 2

## 2015-02-12 MED ORDER — LIDOCAINE HCL (CARDIAC) 20 MG/ML IV SOLN
INTRAVENOUS | Status: AC
  Filled 2015-02-12: qty 5

## 2015-02-12 MED ORDER — NEOSTIGMINE METHYLSULFATE 10 MG/10ML IV SOLN
INTRAVENOUS | Status: DC | PRN
  Administered 2015-02-12: 3 mg via INTRAVENOUS

## 2015-02-12 MED ORDER — ARTIFICIAL TEARS OP OINT
TOPICAL_OINTMENT | OPHTHALMIC | Status: AC
  Filled 2015-02-12: qty 3.5

## 2015-02-12 MED ORDER — ACETAMINOPHEN 650 MG RE SUPP: 650.0000 mg | Freq: Four times a day (QID) | RECTAL | Status: DC | PRN

## 2015-02-12 MED ORDER — MORPHINE SULFATE (PF) 2 MG/ML IV SOLN
1.0000 mg | INTRAVENOUS | Status: DC | PRN
  Administered 2015-02-12 – 2015-02-14 (×3): 1 mg via INTRAVENOUS
  Filled 2015-02-12 (×3): qty 1

## 2015-02-12 MED ORDER — OXYCODONE HCL 5 MG PO TABS
5.0000 mg | ORAL_TABLET | ORAL | Status: DC | PRN
Start: 2015-02-12 — End: 2015-02-16
  Administered 2015-02-12 – 2015-02-15 (×11): 10 mg via ORAL
  Filled 2015-02-12 (×11): qty 2

## 2015-02-12 MED ORDER — OXYCODONE-ACETAMINOPHEN 5-325 MG PO TABS
1.0000 | ORAL_TABLET | Freq: Four times a day (QID) | ORAL | Status: DC | PRN
  Administered 2015-02-12 – 2015-02-15 (×7): 2 via ORAL
  Filled 2015-02-12: qty 1
  Filled 2015-02-12 (×5): qty 2
  Filled 2015-02-12: qty 1

## 2015-02-12 MED ORDER — SODIUM CHLORIDE 0.9 % IV SOLN
INTRAVENOUS | Status: DC
  Administered 2015-02-12: 75 mL/h via INTRAVENOUS

## 2015-02-12 MED ORDER — MORPHINE SULFATE 2 MG/ML IV SOLN: INTRAVENOUS | Status: DC

## 2015-02-12 MED ORDER — FENTANYL CITRATE (PF) 250 MCG/5ML IJ SOLN
INTRAMUSCULAR | Status: AC
Start: 2015-02-12 — End: 2015-02-12
  Filled 2015-02-12: qty 5

## 2015-02-12 MED ORDER — ONDANSETRON HCL 4 MG/2ML IJ SOLN
INTRAMUSCULAR | Status: DC | PRN
  Administered 2015-02-12: 4 mg via INTRAVENOUS

## 2015-02-12 MED ORDER — CEFAZOLIN SODIUM-DEXTROSE 2-3 GM-% IV SOLR
2.0000 g | INTRAVENOUS | Status: AC
  Administered 2015-02-12: 2 g via INTRAVENOUS
  Filled 2015-02-12: qty 50

## 2015-02-12 MED ORDER — DOCUSATE SODIUM 100 MG PO CAPS: 100.0000 mg | ORAL_CAPSULE | Freq: Two times a day (BID) | ORAL | Status: DC

## 2015-02-12 MED ORDER — POLYETHYLENE GLYCOL 3350 17 G PO PACK
17.0000 g | PACK | Freq: Every day | ORAL | Status: DC
  Administered 2015-02-12 – 2015-02-15 (×4): 17 g via ORAL
  Filled 2015-02-12 (×4): qty 1

## 2015-02-12 MED ORDER — FENTANYL CITRATE (PF) 250 MCG/5ML IJ SOLN
INTRAMUSCULAR | Status: AC
  Filled 2015-02-12: qty 5

## 2015-02-12 MED ORDER — PHENYLEPHRINE HCL 10 MG/ML IJ SOLN
10.0000 mg | INTRAVENOUS | Status: DC | PRN
  Administered 2015-02-12: 10 ug/min via INTRAVENOUS

## 2015-02-12 MED ORDER — HYDROMORPHONE HCL 1 MG/ML IJ SOLN
0.2500 mg | INTRAMUSCULAR | Status: DC | PRN
  Administered 2015-02-12 (×4): 0.5 mg via INTRAVENOUS

## 2015-02-12 MED ORDER — ROCURONIUM BROMIDE 100 MG/10ML IV SOLN
INTRAVENOUS | Status: DC | PRN
  Administered 2015-02-12: 50 mg via INTRAVENOUS
  Administered 2015-02-12: 20 mg via INTRAVENOUS

## 2015-02-12 MED ORDER — METOCLOPRAMIDE HCL 5 MG PO TABS: 5.0000 mg | ORAL_TABLET | Freq: Three times a day (TID) | ORAL | Status: DC | PRN

## 2015-02-12 MED ORDER — MAGNESIUM CITRATE PO SOLN: 1.0000 | Freq: Once | ORAL | Status: DC | PRN

## 2015-02-12 MED ORDER — OXYCODONE-ACETAMINOPHEN 5-325 MG PO TABS
ORAL_TABLET | ORAL | Status: AC
  Administered 2015-02-13: 2 via ORAL
  Filled 2015-02-12: qty 2

## 2015-02-12 MED ORDER — LACTATED RINGERS IV SOLN
INTRAVENOUS | Status: DC
  Administered 2015-02-12: 13:00:00 via INTRAVENOUS

## 2015-02-12 MED ORDER — BISACODYL 5 MG PO TBEC
5.0000 mg | DELAYED_RELEASE_TABLET | Freq: Every day | ORAL | Status: DC | PRN
  Administered 2015-02-15: 5 mg via ORAL
  Filled 2015-02-12: qty 1

## 2015-02-12 MED ORDER — HYDROXYZINE HCL 25 MG PO TABS: 50.0000 mg | ORAL_TABLET | Freq: Three times a day (TID) | ORAL | Status: DC | PRN

## 2015-02-12 MED ORDER — MIDAZOLAM HCL 2 MG/2ML IJ SOLN
INTRAMUSCULAR | Status: AC
  Filled 2015-02-12: qty 4

## 2015-02-12 MED ORDER — METHOCARBAMOL 1000 MG/10ML IJ SOLN
500.0000 mg | Freq: Four times a day (QID) | INTRAVENOUS | Status: DC | PRN
  Filled 2015-02-12: qty 5

## 2015-02-12 MED ORDER — FENTANYL CITRATE (PF) 100 MCG/2ML IJ SOLN
INTRAMUSCULAR | Status: DC | PRN
  Administered 2015-02-12 (×4): 100 ug via INTRAVENOUS

## 2015-02-12 MED ORDER — METHOCARBAMOL 500 MG PO TABS
500.0000 mg | ORAL_TABLET | Freq: Four times a day (QID) | ORAL | Status: DC | PRN
  Administered 2015-02-12 – 2015-02-15 (×9): 500 mg via ORAL
  Filled 2015-02-12 (×9): qty 1

## 2015-02-12 MED ORDER — ACETAMINOPHEN 325 MG PO TABS: 650.0000 mg | ORAL_TABLET | Freq: Four times a day (QID) | ORAL | Status: DC | PRN

## 2015-02-12 MED ORDER — METHOCARBAMOL 500 MG PO TABS
ORAL_TABLET | ORAL | Status: AC
  Filled 2015-02-12: qty 1

## 2015-02-12 MED ORDER — SODIUM CHLORIDE 0.9 % IJ SOLN
INTRAMUSCULAR | Status: AC
  Filled 2015-02-12: qty 10

## 2015-02-12 MED ORDER — POTASSIUM CHLORIDE IN NACL 20-0.9 MEQ/L-% IV SOLN
INTRAVENOUS | Status: DC
  Administered 2015-02-13: 08:00:00 via INTRAVENOUS
  Filled 2015-02-12: qty 1000

## 2015-02-12 MED ORDER — SODIUM CHLORIDE 0.9 % IJ SOLN: 9.0000 mL | INTRAMUSCULAR | Status: DC | PRN

## 2015-02-12 MED ORDER — SUCCINYLCHOLINE CHLORIDE 20 MG/ML IJ SOLN
INTRAMUSCULAR | Status: AC
  Filled 2015-02-12: qty 1

## 2015-02-12 MED ORDER — ONDANSETRON HCL 4 MG PO TABS
4.0000 mg | ORAL_TABLET | Freq: Four times a day (QID) | ORAL | Status: DC | PRN
  Administered 2015-02-13: 4 mg via ORAL
  Filled 2015-02-12: qty 1

## 2015-02-12 MED ORDER — SENNA 8.6 MG PO TABS: 1.0000 | ORAL_TABLET | Freq: Two times a day (BID) | ORAL | Status: DC

## 2015-02-12 MED ORDER — CEFAZOLIN SODIUM-DEXTROSE 2-3 GM-% IV SOLR
INTRAVENOUS | Status: AC
  Filled 2015-02-12: qty 50

## 2015-02-12 MED ORDER — DIPHENHYDRAMINE HCL 50 MG/ML IJ SOLN: 12.5000 mg | Freq: Four times a day (QID) | INTRAMUSCULAR | Status: DC | PRN

## 2015-02-12 MED ORDER — ROCURONIUM BROMIDE 50 MG/5ML IV SOLN
INTRAVENOUS | Status: AC
  Filled 2015-02-12: qty 1

## 2015-02-12 MED ORDER — DIPHENHYDRAMINE HCL 12.5 MG/5ML PO ELIX
12.5000 mg | ORAL_SOLUTION | Freq: Four times a day (QID) | ORAL | Status: DC | PRN
Start: 2015-02-12 — End: 2015-02-12

## 2015-02-12 MED ORDER — PROPOFOL 10 MG/ML IV BOLUS
INTRAVENOUS | Status: AC
  Filled 2015-02-12: qty 20

## 2015-02-12 MED ORDER — PROMETHAZINE HCL 25 MG/ML IJ SOLN: 6.2500 mg | INTRAMUSCULAR | Status: DC | PRN

## 2015-02-12 MED ORDER — ENOXAPARIN SODIUM 40 MG/0.4ML ~~LOC~~ SOLN
40.0000 mg | SUBCUTANEOUS | Status: DC
Start: 2015-02-13 — End: 2015-02-16
  Administered 2015-02-13 – 2015-02-15 (×3): 40 mg via SUBCUTANEOUS
  Filled 2015-02-12 (×3): qty 0.4

## 2015-02-12 MED ORDER — ENOXAPARIN SODIUM 30 MG/0.3ML ~~LOC~~ SOLN: 30.0000 mg | Freq: Two times a day (BID) | SUBCUTANEOUS | Status: DC

## 2015-02-12 MED ORDER — GLYCOPYRROLATE 0.2 MG/ML IJ SOLN
INTRAMUSCULAR | Status: DC | PRN
  Administered 2015-02-12: .4 mg via INTRAVENOUS

## 2015-02-12 MED ORDER — LORAZEPAM 2 MG/ML IJ SOLN: 1.0000 mg | Freq: Once | INTRAMUSCULAR | Status: DC

## 2015-02-12 MED ORDER — METHOCARBAMOL 1000 MG/10ML IJ SOLN
1000.0000 mg | Freq: Three times a day (TID) | INTRAVENOUS | Status: DC
  Filled 2015-02-12 (×4): qty 10

## 2015-02-12 MED ORDER — MORPHINE SULFATE (PF) 2 MG/ML IV SOLN: 2.0000 mg | INTRAVENOUS | Status: DC | PRN

## 2015-02-12 MED ORDER — EPHEDRINE SULFATE 50 MG/ML IJ SOLN
INTRAMUSCULAR | Status: AC
  Filled 2015-02-12: qty 1

## 2015-02-12 MED ORDER — MIDAZOLAM HCL 5 MG/5ML IJ SOLN
INTRAMUSCULAR | Status: DC | PRN
  Administered 2015-02-12: 2 mg via INTRAVENOUS

## 2015-02-12 MED ORDER — ONDANSETRON HCL 4 MG/2ML IJ SOLN: 4.0000 mg | Freq: Four times a day (QID) | INTRAMUSCULAR | Status: DC | PRN

## 2015-02-12 MED ORDER — ACETAMINOPHEN 10 MG/ML IV SOLN: 1000.0000 mg | Freq: Four times a day (QID) | INTRAVENOUS | Status: DC

## 2015-02-12 MED ORDER — DOCUSATE SODIUM 100 MG PO CAPS
100.0000 mg | ORAL_CAPSULE | Freq: Two times a day (BID) | ORAL | Status: DC
  Administered 2015-02-12 – 2015-02-15 (×7): 100 mg via ORAL
  Filled 2015-02-12 (×7): qty 1

## 2015-02-12 MED ORDER — 0.9 % SODIUM CHLORIDE (POUR BTL) OPTIME
TOPICAL | Status: DC | PRN
Start: 2015-02-12 — End: 2015-02-12
  Administered 2015-02-12: 1000 mL

## 2015-02-12 MED ORDER — NALOXONE HCL 0.4 MG/ML IJ SOLN: 0.4000 mg | INTRAMUSCULAR | Status: DC | PRN

## 2015-02-12 MED ORDER — NEOSTIGMINE METHYLSULFATE 10 MG/10ML IV SOLN
INTRAVENOUS | Status: AC
  Filled 2015-02-12: qty 1

## 2015-02-12 MED ORDER — ONDANSETRON HCL 4 MG/2ML IJ SOLN
4.0000 mg | Freq: Four times a day (QID) | INTRAMUSCULAR | Status: DC | PRN
  Administered 2015-02-12 – 2015-02-15 (×2): 4 mg via INTRAVENOUS
  Filled 2015-02-12 (×2): qty 2

## 2015-02-12 MED ORDER — LACTATED RINGERS IV SOLN
INTRAVENOUS | Status: DC | PRN
  Administered 2015-02-12 (×3): via INTRAVENOUS

## 2015-02-12 MED ORDER — HYDROCODONE-ACETAMINOPHEN 5-325 MG PO TABS: 1.0000 | ORAL_TABLET | ORAL | Status: DC | PRN

## 2015-02-12 MED ORDER — CEFAZOLIN SODIUM 1-5 GM-% IV SOLN
1.0000 g | Freq: Four times a day (QID) | INTRAVENOUS | Status: AC
  Administered 2015-02-12 – 2015-02-13 (×3): 1 g via INTRAVENOUS
  Filled 2015-02-12 (×4): qty 50

## 2015-02-12 MED ORDER — PROPOFOL 10 MG/ML IV BOLUS
INTRAVENOUS | Status: DC | PRN
  Administered 2015-02-12: 150 mg via INTRAVENOUS

## 2015-02-12 MED ORDER — METOCLOPRAMIDE HCL 5 MG/ML IJ SOLN: 5.0000 mg | Freq: Three times a day (TID) | INTRAMUSCULAR | Status: DC | PRN

## 2015-02-12 MED ORDER — ONDANSETRON HCL 4 MG/2ML IJ SOLN
INTRAMUSCULAR | Status: AC
  Filled 2015-02-12: qty 2

## 2015-02-12 SURGICAL SUPPLY — 74 items
BANDAGE ELASTIC 4 VELCRO ST LF (GAUZE/BANDAGES/DRESSINGS) ×3 IMPLANT
BANDAGE ELASTIC 6 VELCRO ST LF (GAUZE/BANDAGES/DRESSINGS) ×1 IMPLANT
BANDAGE ESMARK 6X9 LF (GAUZE/BANDAGES/DRESSINGS) IMPLANT
BIT DRILL CALIBRATED 4.3MMX365 (DRILL) IMPLANT
BIT DRILL CROWE PNT TWST 4.5MM (DRILL) IMPLANT
BNDG CMPR 9X6 STRL LF SNTH (GAUZE/BANDAGES/DRESSINGS)
BNDG CMPR MED 10X6 ELC LF (GAUZE/BANDAGES/DRESSINGS) ×1
BNDG COHESIVE 4X5 TAN STRL (GAUZE/BANDAGES/DRESSINGS) ×3 IMPLANT
BNDG ELASTIC 6X10 VLCR STRL LF (GAUZE/BANDAGES/DRESSINGS) ×2 IMPLANT
BNDG ESMARK 6X9 LF (GAUZE/BANDAGES/DRESSINGS)
BNDG GAUZE ELAST 4 BULKY (GAUZE/BANDAGES/DRESSINGS) ×5 IMPLANT
BRUSH SCRUB DISP (MISCELLANEOUS) ×6 IMPLANT
COVER MAYO STAND STRL (DRAPES) ×3 IMPLANT
COVER SURGICAL LIGHT HANDLE (MISCELLANEOUS) ×4 IMPLANT
DRAPE C-ARM 42X72 X-RAY (DRAPES) ×3 IMPLANT
DRAPE C-ARMOR (DRAPES) ×3 IMPLANT
DRAPE IMP U-DRAPE 54X76 (DRAPES) ×3 IMPLANT
DRAPE INCISE IOBAN 66X45 STRL (DRAPES) ×5 IMPLANT
DRAPE ORTHO SPLIT 77X108 STRL (DRAPES) ×6
DRAPE SURG ORHT 6 SPLT 77X108 (DRAPES) ×2 IMPLANT
DRAPE U-SHAPE 47X51 STRL (DRAPES) ×3 IMPLANT
DRILL CALIBRATED 4.3MMX365 (DRILL) ×3
DRILL CROWE POINT TWIST 4.5MM (DRILL) ×3
DRSG ADAPTIC 3X8 NADH LF (GAUZE/BANDAGES/DRESSINGS) ×2 IMPLANT
DRSG MEPILEX BORDER 4X4 (GAUZE/BANDAGES/DRESSINGS) ×4 IMPLANT
DRSG PAD ABDOMINAL 8X10 ST (GAUZE/BANDAGES/DRESSINGS) ×4 IMPLANT
ELECT REM PT RETURN 9FT ADLT (ELECTROSURGICAL) ×3
ELECTRODE REM PT RTRN 9FT ADLT (ELECTROSURGICAL) ×1 IMPLANT
EVACUATOR 1/8 PVC DRAIN (DRAIN) IMPLANT
GAUZE SPONGE 4X4 12PLY STRL (GAUZE/BANDAGES/DRESSINGS) ×3 IMPLANT
GAUZE XEROFORM 1X8 LF (GAUZE/BANDAGES/DRESSINGS) ×1 IMPLANT
GLOVE BIO SURGEON STRL SZ7.5 (GLOVE) ×3 IMPLANT
GLOVE BIO SURGEON STRL SZ8 (GLOVE) ×3 IMPLANT
GLOVE BIOGEL PI IND STRL 7.5 (GLOVE) ×1 IMPLANT
GLOVE BIOGEL PI IND STRL 8 (GLOVE) ×1 IMPLANT
GLOVE BIOGEL PI INDICATOR 7.5 (GLOVE) ×2
GLOVE BIOGEL PI INDICATOR 8 (GLOVE) ×2
GLOVE XGUARD RR 2 7.5 (GLOVE) IMPLANT
GLOVE XGUARD RR2 7.5 (GLOVE) ×3
GOWN STRL REUS W/ TWL LRG LVL3 (GOWN DISPOSABLE) ×2 IMPLANT
GOWN STRL REUS W/ TWL XL LVL3 (GOWN DISPOSABLE) ×1 IMPLANT
GOWN STRL REUS W/TWL LRG LVL3 (GOWN DISPOSABLE) ×12
GOWN STRL REUS W/TWL XL LVL3 (GOWN DISPOSABLE) ×3
GUIDEWIRE BEAD TIP (WIRE) ×2 IMPLANT
KIT BASIN OR (CUSTOM PROCEDURE TRAY) ×3 IMPLANT
KIT ROOM TURNOVER OR (KITS) ×3 IMPLANT
MANIFOLD NEPTUNE II (INSTRUMENTS) ×2 IMPLANT
NAIL FEM RETRO 12X380 (Nail) ×2 IMPLANT
PACK ORTHO EXTREMITY (CUSTOM PROCEDURE TRAY) ×3 IMPLANT
PACK UNIVERSAL I (CUSTOM PROCEDURE TRAY) ×3 IMPLANT
PAD ARMBOARD 7.5X6 YLW CONV (MISCELLANEOUS) ×6 IMPLANT
PIN GUIDE 3.2 903003004 (MISCELLANEOUS) ×2 IMPLANT
SCREW CORT TI DBL LEAD 5X34 (Screw) ×2 IMPLANT
SCREW CORT TI DBL LEAD 5X36 (Screw) ×2 IMPLANT
SCREW CORT TI DBL LEAD 5X60 (Screw) ×2 IMPLANT
SCREW CORT TI DBL LEAD 5X65 (Screw) ×2 IMPLANT
SCREW CORT TI DBL LEAD 5X75 (Screw) ×2 IMPLANT
SPONGE LAP 18X18 X RAY DECT (DISPOSABLE) ×1 IMPLANT
STAPLER VISISTAT 35W (STAPLE) ×3 IMPLANT
STOCKINETTE IMPERVIOUS LG (DRAPES) ×3 IMPLANT
SUT ETHILON 3 0 PS 1 (SUTURE) ×4 IMPLANT
SUT PROLENE 3 0 PS 2 (SUTURE) IMPLANT
SUT VIC AB 0 CT1 27 (SUTURE) ×3
SUT VIC AB 0 CT1 27XBRD ANBCTR (SUTURE) IMPLANT
SUT VIC AB 1 CT1 27 (SUTURE) ×3
SUT VIC AB 1 CT1 27XBRD ANBCTR (SUTURE) IMPLANT
SUT VIC AB 2-0 CT1 27 (SUTURE) ×3
SUT VIC AB 2-0 CT1 TAPERPNT 27 (SUTURE) IMPLANT
SUT VIC AB 2-0 CT3 27 (SUTURE) ×2 IMPLANT
TOWEL OR 17X24 6PK STRL BLUE (TOWEL DISPOSABLE) ×5 IMPLANT
TOWEL OR 17X26 10 PK STRL BLUE (TOWEL DISPOSABLE) ×6 IMPLANT
TUBE CONNECTING 12'X1/4 (SUCTIONS) ×1
TUBE CONNECTING 12X1/4 (SUCTIONS) ×2 IMPLANT
YANKAUER SUCT BULB TIP NO VENT (SUCTIONS) ×3 IMPLANT

## 2015-02-12 NOTE — Transfer of Care (Signed)
Immediate Anesthesia Transfer of Care Note  Patient: Isabella [name]  Procedure(s) Performed: Procedure(s): INTRAMEDULLARY (IM) RETROGRADE FEMORAL NAILING (Left)  Patient Location: PACU  Anesthesia Type:General  Level of Consciousness: awake, sedated and patient cooperative  Airway & Oxygen Therapy: Patient Spontanous Breathing and Patient connected to face mask oxygen  Post-op Assessment: Report given to RN, Post -op Vital signs reviewed and stable, Patient moving all extremities and Patient moving all extremities X 4  Post vital signs: Reviewed and stable  Last Vitals:  Filed Vitals:   02/12/15 1745  BP: 119/57  Pulse: 76  Temp: 37 C  Resp: 8    Complications: No apparent anesthesia complications

## 2015-02-12 NOTE — Anesthesia Postprocedure Evaluation (Signed)
Anesthesia Post Note  Patient: Isabella [name]  Procedure(s) Performed: Procedure(s) (LRB): INTRAMEDULLARY (IM) RETROGRADE FEMORAL NAILING (Left)  Anesthesia type: general  Patient location: PACU  Post pain: Pain level controlled  Post assessment: Patient's Cardiovascular Status Stable  Last Vitals:  Filed Vitals:   02/12/15 1856  BP: 126/65  Pulse: 63  Temp: 36.9 C  Resp: 16    Post vital signs: Reviewed and stable  Level of consciousness: sedated  Complications: No apparent anesthesia complications

## 2015-02-12 NOTE — ED Provider Notes (Signed)
CSN: [id number]     Arrival date & time 02/11/15  2056 History   First MD Initiated Contact with Patient 02/11/15 2109     Chief Complaint  Patient presents with  . Fall  . Leg Pain     (Consider location/radiation/quality/duration/timing/severity/associated sxs/prior Treatment) Patient is a 59 y.o. female presenting with fall and leg pain. The history is provided by the patient. No language interpreter was used.  Fall This is a new problem. The current episode started today. Pertinent negatives include no abdominal pain, chest pain, fever, headaches or nausea. Associated symptoms comments: Patient presents with left LE pain after fall while getting out of her parked vehicle and slipping on uneven surface. She states her right leg slipped forward and her left leg twisted and folded backward. No head injury, abdominal pain, chest or neck pain. .  Leg Pain Associated symptoms: no fever     Past Medical History  Diagnosis Date  . Cancer [facility])    Past Surgical History  Procedure Laterality Date  . Breast surgery     History reviewed. No pertinent family history. Social History  Substance Use Topics  . Smoking status: Never Smoker   . Smokeless tobacco: Never Used  . Alcohol Use: No   OB History    No data available     Review of Systems  Constitutional: Negative for fever.  Respiratory: Negative for shortness of breath.   Cardiovascular: Negative for chest pain.  Gastrointestinal: Negative for nausea and abdominal pain.  Musculoskeletal:       See HPI.  Skin: Negative for wound.  Neurological: Negative for syncope and headaches.      Allergies  Review of patient's allergies indicates no known allergies.  Home Medications   Prior to Admission medications   Not on File   BP 126/72 mmHg  Pulse 72  Temp(Src) 97.7 F (36.5 C) (Oral)  Resp 16  SpO2 96% Physical Exam  Constitutional: She is oriented to person, place, and time. She appears well-developed and  well-nourished.  HENT:  Head: Normocephalic.  Neck: Normal range of motion. Neck supple.  Cardiovascular: Normal rate, regular rhythm and intact distal pulses.   Pulmonary/Chest: Effort normal and breath sounds normal.  Abdominal: Soft. Bowel sounds are normal. There is no tenderness. There is no rebound and no guarding.  Musculoskeletal: Normal range of motion.  Presents via EMS with left LE immobilized. Immobilizer left in place. Significant tender to mid- to distal left thigh without gross deformity. Knee minimally tender. She is tender along tibia, greatest in mid shaft. No ankle or lower leg swelling, discoloration.   Neurological: She is alert and oriented to person, place, and time. Coordination normal.  Neurovascularly intact left LE.  Skin: Skin is warm and dry. No rash noted.  Psychiatric: She has a normal mood and affect.    ED Course  Procedures (including critical care time) Labs Review Labs Reviewed  CBC WITH DIFFERENTIAL/PLATELET - Abnormal; Notable for the following:    WBC 12.1 (*)    Neutro Abs 10.5 (*)    All other components within normal limits  BASIC METABOLIC PANEL - Abnormal; Notable for the following:    Chloride 100 (*)    Glucose, Bld 194 (*)    Calcium 8.4 (*)    All other components within normal limits    Imaging Review Dg Tibia/fibula Left  02/11/2015  CLINICAL DATA:  Fall tonight with left leg pain, initial encounter EXAM: LEFT TIBIA AND FIBULA - 2 VIEW  COMPARISON:  None. FINDINGS: There is no evidence of fracture or other focal bone lesions. Soft tissues are unremarkable. IMPRESSION: No acute abnormality noted. Electronically Signed   By: Inez Catalina M.D.   On: 02/11/2015 22:37   Dg Chest Port 1 View  02/11/2015  CLINICAL DATA:  Preoperative evaluation EXAM: PORTABLE CHEST - 1 VIEW COMPARISON:  01/01/2015 FINDINGS: The heart size and mediastinal contours are within normal limits. Both lungs are clear. The visualized skeletal structures are  unremarkable. Soft tissue calcification is noted over right hemi thorax which projects in the right breast on recent CT. IMPRESSION: No acute abnormality noted. Electronically Signed   By: Inez Catalina M.D.   On: 02/11/2015 23:45   Dg Knee Complete 4 Views Left  02/11/2015  CLINICAL DATA:  Fall tonight with distal left leg pain, initial encounter EXAM: LEFT KNEE - COMPLETE 4+ VIEW COMPARISON:  None. FINDINGS: Spiral fracture is again noted to the distal femur. It does not appear to involve the knee joint. No joint effusion is seen. No other fracture or dislocation is noted. IMPRESSION: Distal left femoral fracture Electronically Signed   By: Inez Catalina M.D.   On: 02/11/2015 22:38   Dg Femur Min 2 Views Left  02/11/2015  CLINICAL DATA:  Fall tonight with distal left leg pain, initial encounter EXAM: LEFT FEMUR 2 VIEWS COMPARISON:  None. FINDINGS: Proximal left femur is within normal limits. Multiple radiopaque densities are identified overlying the upper thigh consistent with overlying clothing. There is a oblique fracture of the distal femur with some lateral displacement of the distal fracture fragment as well as mild impaction and angulation at the fracture site. IMPRESSION: Oblique fracture of the distal left femur. Electronically Signed   By: Inez Catalina M.D.   On: 02/11/2015 22:37   I have personally reviewed and evaluated these images and lab results as part of my medical decision-making.   EKG Interpretation None      MDM   Final diagnoses:  Closed fracture of left femur, unspecified fracture morphology, unspecified portion of femur, initial encounter [facility])    Patient is put into Buck's traction with Watson-jones dressing per Dr. Doran Durand. She will be admitted to orthopedic floor with plan for surgery in the morning. NPO after midnight. Pain management addressed. VSS.     Charlann Lange, PA-C 02/12/15 0012  Forde Dandy, MD 02/12/15 [phone]

## 2015-02-12 NOTE — Anesthesia Preprocedure Evaluation (Addendum)
Anesthesia Evaluation  Patient identified by MRN, date of birth, ID band Patient awake    Reviewed: Allergy & Precautions, NPO status , Patient's Chart, lab work & pertinent test results  History of Anesthesia Complications Negative for: history of anesthetic complications  Airway Mallampati: II  TM Distance: >3 FB Neck ROM: Full    Dental  (+) Edentulous Upper, Dental Advisory Given   Pulmonary neg pulmonary ROS,    Pulmonary exam normal        Cardiovascular negative cardio ROS Normal cardiovascular exam  Study Conclusions  - Left ventricle: Global Longitudinal LV strain is normal at -21% The cavity size was normal. Systolic function was normal. The estimated ejection fraction was in the range of 60% to 65%. Wall motion was normal; there were no regional wall motion abnormalities.    Neuro/Psych PSYCHIATRIC DISORDERS Anxiety negative neurological ROS     GI/Hepatic negative GI ROS, Neg liver ROS,   Endo/Other  Morbid obesity  Renal/GU negative Renal ROS     Musculoskeletal   Abdominal   Peds  Hematology   Anesthesia Other Findings   Reproductive/Obstetrics                           Anesthesia Physical Anesthesia Plan  ASA: III  Anesthesia Plan: General   Post-op Pain Management:    Induction: Intravenous  Airway Management Planned: Oral ETT  Additional Equipment:   Intra-op Plan:   Post-operative Plan: Extubation in OR  Informed Consent: I have reviewed the patients History and Physical, chart, labs and discussed the procedure including the risks, benefits and alternatives for the proposed anesthesia with the patient or authorized representative who has indicated his/her understanding and acceptance.   Dental advisory given  Plan Discussed with: CRNA and Anesthesiologist  Anesthesia Plan Comments:        Anesthesia Quick Evaluation

## 2015-02-12 NOTE — ED Notes (Signed)
Ortho Tech at bedside to place bucks traction.

## 2015-02-12 NOTE — Anesthesia Procedure Notes (Signed)
Procedure Name: Intubation Date/Time: 02/12/2015 3:28 PM Performed by: Izora Gala Pre-anesthesia Checklist: Patient identified, Emergency Drugs available, Suction available and Patient being monitored Patient Re-evaluated:Patient Re-evaluated prior to inductionOxygen Delivery Method: Circle system utilized Preoxygenation: Pre-oxygenation with 100% oxygen Intubation Type: IV induction Ventilation: Mask ventilation without difficulty Laryngoscope Size: Miller and 3 Grade View: Grade I Tube type: Oral Tube size: 7.5 mm Number of attempts: 1 Airway Equipment and Method: Stylet Placement Confirmation: ETT inserted through vocal cords under direct vision,  positive ETCO2,  CO2 detector and breath sounds checked- equal and bilateral Secured at: 22 cm Tube secured with: Tape Dental Injury: Teeth and Oropharynx as per pre-operative assessment

## 2015-02-12 NOTE — H&P (Signed)
Orthopaedic Trauma Service H&P  CC: Left femoral shaft fracture  HPI:  Patient is a 59-year-old white female who fell off the back of a pickup truck yesterday evening. Patient was helping her mother unload a desk from the truck when she jumped off and slid down an incline portion of the yard. Patient had immediate onset of pain, deformity and inability to bear weight on her left leg. Patient was brought via EMS to [facility] for evaluation. She was found to have a shortened left distal third femoral shaft fracture. Orthopedic trauma service contacted for definitive management. Patient was placed into Buck's traction at the direction of the on-call orthopedist last night. Orthopedic trauma service is evaluating the patient this morning.  Patient reports a severe pain in her left leg has had difficulty sleeping given spasms and movement of her fracture. She denies any injuries elsewhere. Denies really any other pertinent medical history. She denies any numbness or tingling in her left leg.  Patient does also note a recent admission to [facility] for atypical chest pain. She was admitted to the medicine service and had extensive workup by cardiology including echo and Myoview study. These were essentially negative for any acute cardiac etiology for her chest pain. She had negative troponins during her hospital stay as well. Her EKG was normal as well. Patient does not report any chest pain on this admission.    Patient's EF on last admission was 60-65% on echo and 41% on Myoview. Cardiology felt that this was due to some artifact and felt she was stable from a cardiac standpoint. Cardiac CT of chest was also performed which was reassuring. Patient discharged on hospital day #2 with instructions for outpatient workup of noncardiac chest pain. 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  Eyes: Negative for blurred vision.  Respiratory: Negative for shortness of breath and wheezing.   Cardiovascular: Negative for chest pain and palpitations.  Gastrointestinal: Negative for nausea, vomiting and abdominal pain.  Musculoskeletal:       Left lower extremity pain  Neurological: Negative for tingling, sensory change and headaches.    Physical Exam  BP 131/66 mmHg  Pulse 69  Temp(Src) 98.3 F (36.8 C) (Oral)  Resp 18  SpO2 100%  Estimated body mass index is 38.36 kg/(m^2) as calculated from the following:   Height as of 01/01/15: 5' 4" (1.626 m).   Weight as of 01/01/15: 101.424 kg (223 lb 9.6 oz).   Physical Exam  Constitutional: She is oriented to person, place, and time. Vital signs are normal. She appears well-developed and well-nourished. She is cooperative.  Uncomfortable appearing Patient in 10 pounds of Buck's traction. No knee immobilizer  HENT:  Head: Normocephalic and atraumatic.  Mouth/Throat: Mucous membranes are dry.  Eyes: EOM are normal. Right conjunctiva is injected.   Left conjunctiva is injected.  Pupils are equal and round  Neck: Normal range of motion and full passive range of motion without pain. Neck supple. No spinous process tenderness and no muscular tenderness present.  Cardiovascular: Normal rate, regular rhythm, S1 normal and S2 normal.   No murmur heard. Pulmonary/Chest:  Effort normal and breath sounds normal. No respiratory distress. She has no wheezes. She has no rales.  Abdominal: Soft. Bowel sounds are normal. She exhibits no distension. There is no tenderness. There is no guarding.  Obese  Musculoskeletal:  Pelvis--no traumatic wounds or rash, no ecchymosis, stable to manual stress, nontender  Left lower extremity Inspection:   Patient in 10 pounds of Buck's traction   Bulky compressive wrap to thigh noted   Did not Remove Buck's traction to evaluate skin of lower leg Bony eval:   Foot and ankle are nontender   Hip is nontender   Proximal tibia nontender   Exquisite tenderness palpation of her distal femur Soft tissue/skin:   Did not dressing around thigh   Mild swelling noted   Soft tissue of proximal thigh stable   Dystrophic changes of her toenails noted bilaterally ROM:   Range of motion of the knee and hip not assessed   Passive range of motion of ankle and foot intact but patient does have some difficulty with active extension of her great toe Sensation:   DPN, SPN, TN sensory functions intact Motor:   Again weak EHL   FHL and lesser toe motor functions grossly intact   Weak ankle extension   Ankle flexion intact   Inversion and eversion grossly intact Vascular:   Extremity is warm   + DP pulse   Compartments soft and nontender  Bilateral upper extremity and right lower extremity   Right shoulder is sore right foot patient can perform full active range of motion   Motor and sensory functions are intact bilateral upper extremities as well as lower extremity   Palpable peripheral pulses are noted   No other acute findings were noted  Feet:  Right Foot:  Skin Integrity: Negative for ulcer.  Left Foot:  Skin Integrity: Negative for ulcer.  Neurological: She is alert and oriented to person, place, and time.  Skin: Skin is warm and intact.  Psychiatric: She has a normal mood and affect. Her behavior is normal. Judgment and  thought content normal.  Nursing note and vitals reviewed.  Labs Results for [name], [name] (MRN [id number]) as of 02/12/2015 09:47  Ref. Range 02/11/2015 23:21  Sodium Latest Ref Range: 135-145 mmol/L 135  Potassium Latest Ref Range: 3.5-5.1 mmol/L 3.7  Chloride Latest Ref Range: 101-111 mmol/L 100 (L)  CO2 Latest Ref Range: 22-32 mmol/L 24  BUN Latest Ref Range: 6-20 mg/dL 11  Creatinine Latest Ref Range: 0.44-1.00 mg/dL 0.97  Calcium Latest Ref Range: 8.9-10.3 mg/dL 8.4 (L)  EGFR (Non-African Amer.) Latest Ref Range: >60 mL/min >60  EGFR (African American) Latest Ref Range: >60 mL/min >60  Glucose Latest Ref Range: 65-99 mg/dL 194 (H)  Anion gap Latest Ref Range: 5-15  11  WBC Latest Ref Range: 4.0-10.5 K/uL 12.1 (H)  RBC Latest Ref Range: 3.87-5.11 MIL/uL 4.45  Hemoglobin Latest Ref Range: 12.0-15.0 g/dL 13.0  HCT Latest Ref Range: 36.0-46.0 % 39.3  MCV Latest Ref Range: 78.0-100.0 fL 88.3  MCH Latest Ref Range: 26.0-34.0 pg 29.2  MCHC Latest Ref Range: 30.0-36.0 g/dL 33.1  RDW Latest Ref Range: 11.5-15.5 % 12.8  Platelets Latest Ref Range: 150-400   K/uL 215  Neutrophils Latest Units: % 87  Lymphocytes Latest Units: % 9  Monocytes Relative Latest Units: % 4  Eosinophil Latest Units: % 0  Basophil Latest Units: % 0  NEUT# Latest Ref Range: 1.7-7.7 K/uL 10.5 (H)  Lymphocyte # Latest Ref Range: 0.7-4.0 K/uL 1.0  Monocyte # Latest Ref Range: 0.1-1.0 K/uL 0.5  Eosinophils Absolute Latest Ref Range: 0.0-0.7 K/uL 0.0  Basophils Absolute Latest Ref Range: 0.0-0.1 K/uL 0.0   Imaging/studies  EKG- NSR, no changes from previous study  Chest x-ray  Negative for acute cardiopulmonary disease  X-rays  2 views left femur demonstrates a shortened spiral type fracture of the left distal third femoral shaft. No obvious extension into the joint but fracture does propagate laterally towards the lateral epicondyle   Assessment and plan  59-year-old white female fall off back of truck  with left distal third femoral shaft fracture  1. Fall  2. Left distal third femoral shaft fracture  OR for retrograde IM nail  We'll likely allow partial weightbearing postoperatively  No range of motion restrictions postoperatively    3. Pain management:  Titrate accordingly postop  4. ABL anemia/Hemodynamics   CBC and coags pending  5. Medical issues   Hyperglycemia    HgbA1c pending  6. DVT/PE prophylaxis:   Lovenox post-op  7. ID:   periop abx- Ancef  8. Metabolic Bone Disease:  Relatively low energy mechanism  Will check labs    9. Activity:  Bed rest for now  PT/OT evals post op  10. FEN/Foley/Lines:  NPO for now  Will have foley placed in OR   11. Dispo:  OR this afternoon for IMN L femur   Keith W. Paul, PA-C [facility] [phone] (P) 02/12/2015 9:52 AM  

## 2015-02-12 NOTE — Progress Notes (Addendum)
Orthopedic Tech Progress Note Patient Details:  Isabella [name] 1955-06-17 [id number] Applied Watson-Jones dressing to LLE.  Applied Buck's traction to LLE with 10 lbs weight.  Pulses, sensation, motion intact before and after splinting and application of traction.  Capillary refill less than 2 seconds before and after splinting and application of traction. Ortho Devices Type of Ortho Device: Doran Durand splint Ortho Device/Splint Location: LLE Ortho Device/Splint Interventions: Application   Darrol Poke 02/12/2015, 12:28 AM All treatment provided per verbal orders.

## 2015-02-13 ENCOUNTER — Encounter (HOSPITAL_COMMUNITY): Payer: Self-pay | Admitting: Orthopedic Surgery

## 2015-02-13 LAB — GLUCOSE, CAPILLARY: GLUCOSE-CAPILLARY: 150 mg/dL — AB (ref 65–99)

## 2015-02-13 LAB — BASIC METABOLIC PANEL
ANION GAP: 5 (ref 5–15)
BUN: 6 mg/dL (ref 6–20)
CALCIUM: 8.4 mg/dL — AB (ref 8.9–10.3)
CO2: 30 mmol/L (ref 22–32)
Chloride: 98 mmol/L — ABNORMAL LOW (ref 101–111)
Creatinine, Ser: 0.71 mg/dL (ref 0.44–1.00)
GLUCOSE: 138 mg/dL — AB (ref 65–99)
POTASSIUM: 3.7 mmol/L (ref 3.5–5.1)
Sodium: 133 mmol/L — ABNORMAL LOW (ref 135–145)

## 2015-02-13 LAB — CBC
HEMATOCRIT: 31.7 % — AB (ref 36.0–46.0)
HEMOGLOBIN: 10.7 g/dL — AB (ref 12.0–15.0)
MCH: 29.9 pg (ref 26.0–34.0)
MCHC: 33.8 g/dL (ref 30.0–36.0)
MCV: 88.5 fL (ref 78.0–100.0)
Platelets: 180 10*3/uL (ref 150–400)
RBC: 3.58 MIL/uL — AB (ref 3.87–5.11)
RDW: 13.1 % (ref 11.5–15.5)
WBC: 10.5 10*3/uL (ref 4.0–10.5)

## 2015-02-13 LAB — HEMOGLOBIN A1C
Hgb A1c MFr Bld: 6.5 % — ABNORMAL HIGH (ref 4.8–5.6)
Mean Plasma Glucose: 140 mg/dL

## 2015-02-13 LAB — VITAMIN D 25 HYDROXY (VIT D DEFICIENCY, FRACTURES): VIT D 25 HYDROXY: 14.5 ng/mL — AB (ref 30.0–100.0)

## 2015-02-13 LAB — PTH, INTACT AND CALCIUM
Calcium, Total (PTH): 8.6 mg/dL — ABNORMAL LOW (ref 8.7–10.3)
PTH: 53 pg/mL (ref 15–65)

## 2015-02-13 MED ORDER — VITAMIN C 500 MG PO TABS
500.0000 mg | ORAL_TABLET | Freq: Every day | ORAL | Status: DC
  Administered 2015-02-13 – 2015-02-15 (×3): 500 mg via ORAL
  Filled 2015-02-13 (×3): qty 1

## 2015-02-13 MED ORDER — LIVING WELL WITH DIABETES BOOK
Freq: Once | Status: AC
  Administered 2015-02-13: 11:00:00
  Filled 2015-02-13: qty 1

## 2015-02-13 MED ORDER — VITAMIN D 1000 UNITS PO TABS
1000.0000 [IU] | ORAL_TABLET | Freq: Two times a day (BID) | ORAL | Status: DC
  Administered 2015-02-13 – 2015-02-15 (×6): 1000 [IU] via ORAL
  Filled 2015-02-13 (×6): qty 1

## 2015-02-13 MED ORDER — VITAMIN D (ERGOCALCIFEROL) 1.25 MG (50000 UNIT) PO CAPS
50000.0000 [IU] | ORAL_CAPSULE | ORAL | Status: DC
  Administered 2015-02-13: 50000 [IU] via ORAL
  Filled 2015-02-13: qty 1

## 2015-02-13 MED ORDER — CALCIUM CITRATE 950 (200 CA) MG PO TABS
600.0000 mg | ORAL_TABLET | Freq: Every day | ORAL | Status: DC
  Administered 2015-02-13: 600 mg via ORAL
  Filled 2015-02-13 (×2): qty 3

## 2015-02-13 NOTE — Evaluation (Signed)
Occupational Therapy Evaluation Patient Details Name: MARIELIS SAMARA MRN: [id number] DOB: 13-Dec-1955 Today's Date: 02/13/2015    History of Present Illness Pt is 59 y/o right handed female s/p left intramedullary retrograde femoral nailing Iafter fall from truck.    Clinical Impression   Patient presenting with acute pain, deconditioning, decreased I in self care, decreased I in functional transfers/mobility. Patient reports being I PTA. Patient currently functioning max A overall. Patient will benefit from acute OT to increase overall independence in the areas of ADLs, functional mobility, safety in order to safely discharge home.    Follow Up Recommendations  Home health OT;Supervision/Assistance - 24 hour    Equipment Recommendations  3 in 1 bedside comode;Tub/shower seat    Recommendations for Other Services       Precautions / Restrictions Precautions Precautions: Fall Restrictions Weight Bearing Restrictions: Yes LLE Weight Bearing: Non weight bearing      Mobility Bed Mobility Overal bed mobility: Needs Assistance Bed Mobility: Supine to Sit;Sit to Supine     Supine to sit: Max assist;HOB elevated Sit to supine: HOB elevated;Mod assist   General bed mobility comments: Pt requring assistance for L LE , trunk, and to slide hips forwards to EOB. Pt requiring assistance with B LEs to bed secondary to pain. Pt requring verbal cues for hand placement and proper technique.  Transfers    General transfer comment: Pt refused.     Balance Overall balance assessment: Needs assistance Sitting-balance support: Feet supported;No upper extremity supported Sitting balance-Leahy Scale: Fair       ADL Overall ADL's : Needs assistance/impaired               General ADL Comments: Pt with refusal for OOB tasks this session secondary to 10/10 pain with movement. Pt will need education and demonstration on use of AE for increased I with self care.                 Pertinent Vitals/Pain Pain Assessment: 0-10 Pain Score: 10-Worst pain ever Pain Location: L LE Pain Descriptors / Indicators: Aching;Sore;Guarding;Grimacing Pain Intervention(s): Limited activity within patient's tolerance;Monitored during session;Premedicated before session;Repositioned     Hand Dominance Right   Extremity/Trunk Assessment Upper Extremity Assessment Upper Extremity Assessment: Generalized weakness   Lower Extremity Assessment Lower Extremity Assessment: Defer to PT evaluation       Communication Communication Communication: No difficulties   Cognition Arousal/Alertness: Awake/alert Behavior During Therapy: WFL for tasks assessed/performed Overall Cognitive Status: Within Functional Limits for tasks assessed                           Home Living Family/patient expects to be discharged to:: Private residence Living Arrangements: Spouse/significant other Available Help at Discharge: Family;Friend(s);Available 24 hours/day (Pt lives with boyfriend but is unsure if he can help physically. She has daughter and various other people willing to help per pt report.) Type of Home: House Home Access: Stairs to enter [employer] of Steps: 4 STE Entrance Stairs-Rails: Can reach both Home Layout: One level     Bathroom Shower/Tub: Walk-in shower;Door   [employer] Toilet: Standard Bathroom Accessibility: Yes How Accessible: Accessible via walker Home Equipment: None          Prior Functioning/Environment Level of Independence: Independent             OT Diagnosis: Acute pain;Generalized weakness   OT Problem List: Decreased strength;Decreased activity tolerance;Impaired balance (sitting and/or standing);Decreased safety awareness;Pain;Decreased range of motion;Decreased  knowledge of use of DME or AE;Increased edema   OT Treatment/Interventions: Self-care/ADL training;Balance training;Therapeutic exercise;Therapeutic activities;Energy  conservation;DME and/or AE instruction;Patient/family education    OT Goals(Current goals can be found in the care plan section) Acute Rehab OT Goals Patient Stated Goal: "to go home" and have "less pain" OT Goal Formulation: With patient Time For Goal Achievement: 02/27/15 Potential to Achieve Goals: Good ADL Goals Pt Will Perform Upper Body Bathing: with modified independence;sitting Pt Will Perform Lower Body Bathing: with adaptive equipment;with supervision;sit to/from stand Pt Will Perform Upper Body Dressing: with modified independence;sitting Pt Will Perform Lower Body Dressing: with supervision;with adaptive equipment;sitting/lateral leans Pt Will Transfer to Toilet: with supervision;stand pivot transfer;bedside commode Pt Will Perform Toileting - Clothing Manipulation and hygiene: with supervision;sitting/lateral leans Pt Will Perform Tub/Shower Transfer: shower seat;rolling walker;with supervision;Shower transfer  OT Frequency: Min 2X/week   Barriers to D/C: Other (comment)  none known at this time          End of Session Equipment Utilized During Treatment: Oxygen Nurse Communication: Mobility status  Activity Tolerance: Patient limited by pain Patient left: in bed;with bed alarm set;with call bell/phone within reach   Time: 6770-3403 OT Time Calculation (min): 16 min Charges:  OT General Charges $OT Visit: 1 Procedure OT Evaluation $Initial OT Evaluation Tier I: 1 Procedure  Phineas Semen, MS, OTR/L 02/13/2015, 10:56 AM

## 2015-02-13 NOTE — Progress Notes (Signed)
Orthopaedic Trauma Service Progress Note  Subjective  Doing ok  Quite sore Trying to sleep No other complaints   Review of Systems  Constitutional: Negative for fever and chills.  Respiratory: Negative for shortness of breath and wheezing.   Cardiovascular: Negative for chest pain and palpitations.  Gastrointestinal: Negative for nausea and vomiting.  Genitourinary: Negative for dysuria.  Neurological: Negative for tingling, sensory change and headaches.     Objective   BP 115/65 mmHg  Pulse 82  Temp(Src) 97.8 F (36.6 C) (Oral)  Resp 20  SpO2 100%  Intake/Output      11/01 0701 - 11/02 0700 11/02 0701 - 11/03 0700   P.O. 0 240   I.V. 2000    IV Piggyback 100    Total Intake 2100 240   Urine 1775    Blood 100    Total Output 1875     Net +225 +240        Urine Occurrence  1 x     Labs  Results for Isabella, [name] (MRN [id number]) as of 02/13/2015 09:52  Ref. Range 02/13/2015 06:30  Sodium Latest Ref Range: 135-145 mmol/L 133 (L)  Potassium Latest Ref Range: 3.5-5.1 mmol/L 3.7  Chloride Latest Ref Range: 101-111 mmol/L 98 (L)  CO2 Latest Ref Range: 22-32 mmol/L 30  BUN Latest Ref Range: 6-20 mg/dL 6  Creatinine Latest Ref Range: 0.44-1.00 mg/dL 0.71  Calcium Latest Ref Range: 8.9-10.3 mg/dL 8.4 (L)  EGFR (Non-African Amer.) Latest Ref Range: >60 mL/min >60  EGFR (African American) Latest Ref Range: >60 mL/min >60  Glucose Latest Ref Range: 65-99 mg/dL 138 (H)  Anion gap Latest Ref Range: 5-15  5  WBC Latest Ref Range: 4.0-10.5 K/uL 10.5  RBC Latest Ref Range: 3.87-5.11 MIL/uL 3.58 (L)  Hemoglobin Latest Ref Range: 12.0-15.0 g/dL 10.7 (L)  HCT Latest Ref Range: 36.0-46.0 % 31.7 (L)  MCV Latest Ref Range: 78.0-100.0 fL 88.5  MCH Latest Ref Range: 26.0-34.0 pg 29.9  MCHC Latest Ref Range: 30.0-36.0 g/dL 33.8  RDW Latest Ref Range: 11.5-15.5 % 13.1  Platelets Latest Ref Range: 150-400 K/uL 180   Results for Isabella, [name] (MRN [id number]) as of 02/13/2015  09:52  Ref. Range 02/12/2015 09:40  Vit D, 25-Hydroxy Latest Ref Range: 30.0-100.0 ng/mL 14.5 (L)  Results for Isabella, [name] (MRN [id number]) as of 02/13/2015 09:52  Ref. Range 02/12/2015 09:40  Hemoglobin A1C Latest Ref Range: 4.8-5.6 % 6.5 (H)  Results for Isabella, [name] (MRN [id number]) as of 02/13/2015 09:52  Ref. Range 02/12/2015 09:40  PTH Latest Ref Range: 15-65 pg/mL 53   Results for Isabella, [name] (MRN [id number]) as of 02/13/2015 09:52  Ref. Range 02/12/2015 09:40  TSH Latest Ref Range: 0.350-4.500 uIU/mL 0.912   Exam  Gen: resting comfortably in bed, NAD Lungs: clear anterior fields Cardiac: RRR, S1 and S2 Abd: + BS, NTND Ext:       Left Lower Extremity   Dressing c/d/i  Ext warm  Weak EHL as noted preop  Motor functions intact o/w  DPN, SPN, TN sensation grossly intact  Ext warm   + DP pulse  No DCT   Compartments soft and NT   Assessment and Plan   POD/HD#: 87   59 year old white female fall off back of truck with left distal third femoral shaft fracture  1. Fall  2. Left distal third femoral shaft fracture s/p IMN  NWB left leg  ROM as tolerated  PT/OT evals  Dressing change Friday  Ice and elevate  No pillows under knee at rest   Place pillows under ankle to elevate leg                          3. Pain management:             percocet, Oxy IR and Robaxin    4. ABL anemia/Hemodynamics              stable   5. Medical issues               Hyperglycemia                           HgbA1c- diagnostic of DM   Will have DM coordinator consult   6. DVT/PE prophylaxis:              Lovenox x 3 weeks   7. ID:               periop abx- Ancef  8. Metabolic Bone Disease:             Vitamin D deficiency   PTH shows probable inadequate calcium intake  Will supplement               9. Activity:             per #1  10. FEN/Foley/Lines:             advance diet  Dc foley    11. Dispo:            therapies  Plan for dc home Friday     Jari Pigg, PA-C [facility] [phone] (O) 02/13/2015 9:51 AM

## 2015-02-13 NOTE — Progress Notes (Signed)
Inpatient Diabetes Program Recommendations  AACE/ADA: New Consensus Statement on Inpatient Glycemic Control (2015)  Target Ranges:  Prepandial:   less than 140 mg/dL      Peak postprandial:   less than 180 mg/dL (1-2 hours)      Critically ill patients:  140 - 180 mg/dL    Results for Isabella [name], Isabella [name] (MRN [id number]) as of 02/13/2015 13:48  Ref. Range 01/01/2015 08:55 02/12/2015 09:40  Hemoglobin A1C Latest Ref Range: 4.8-5.6 % 6.2 (H) 6.5 (H)    Results for Isabella [name], Isabella [name] (MRN [id number]) as of 02/13/2015 13:48  Ref. Range 02/11/2015 23:21 02/12/2015 09:40 02/13/2015 06:30  Glucose Latest Ref Range: 65-99 mg/dL 194 (H) 152 (H) 138 (H)     -Consult received for new diagnosis of DM- A1c 6.5%.  -Spoke with pt about new diagnosis.  Discussed A1C results with her and explained what an A1C is, basic pathophysiology of DM Type 2, basic home care, basic diabetes diet nutrition principles, importance of checking CBGs and maintaining good CBG control to prevent long-term and short-term complications.  Also reviewed blood sugar goals at home.    -RNs to provide ongoing basic DM education at bedside with this patient.  Have ordered educational booklet and DM videos.  Have also placed RD consult for DM diet education for this patient.  -Patient told me DM does run in her family.  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  Patient stated she was drinking two 20oz regular mountain dews but has recently decreased her mountain dew intake to two 12 ounce cans.  I commended patient for her willingness to decrease her intake of drinks with sugar, however, I also encouraged her to try to cut out all beverages with sugar.  Patient stated she drinks mostly water and will try to wean down to one can of mountain dew per day.  -Will have RNs allow patient to practice checking her CBGs as well.  -Patient uses Urgent Care for her primary medical care  due to her job as a Administrator.  Will ask care management to see pt to see if we can get her set up with more regular care.     MD- Do you want patient to check her CBGs at home?  If so, please give patient a Rx for CBG meter after d/c.  Order # [id number].  Likely does not need DM medication at this time, however, may benefit from low dose Metformin 500 mg bid at home.  Metformin can be purchased at [employer] for $4.      --Will follow patient during hospitalization--  Wyn Quaker RN, MSN, CDE Diabetes Coordinator Inpatient Glycemic Control Team Team Pager: [phone] (8a-5p)

## 2015-02-13 NOTE — Plan of Care (Signed)
Problem: Food- and Nutrition-Related Knowledge Deficit (NB-1.1) Goal: Nutrition education Formal process to instruct or train a patient/client in a skill or to impart knowledge to help patients/clients voluntarily manage or modify food choices and eating behavior to maintain or improve health.  Outcome: Completed/Met Date Met:  02/13/15  RD consulted for nutrition education regarding diabetes.     Lab Results  Component Value Date    HGBA1C 6.5* 02/12/2015    RD provided "Carbohydrate Counting for People with Diabetes" handout from the Academy of [employer] and Dietetics. Discussed different food groups and their effects on blood sugar, emphasizing carbohydrate-containing foods. Provided list of carbohydrates and recommended serving sizes of common foods.  Discussed importance of controlled and consistent carbohydrate intake throughout the day. Recommended 4 servings of carbohydrates at each meal. Emphasized adequate protein intake. Provided examples of ways to balance meals/snacks and encouraged intake of high-fiber, whole grain complex carbohydrates. Discussed diabetic friendly drink options.Teach back method used.  Expect good compliance.  Current diet order is carbohydrate modified. Pt reports appetite is fine currently and PTA with no other difficulties. Labs and medications reviewed. No further nutrition interventions warranted at this time. RD contact information provided. If additional nutrition issues arise, please re-consult RD.  Corrin Parker, MS, RD, LDN Pager # [phone] After hours/ weekend pager # [phone]

## 2015-02-13 NOTE — Evaluation (Signed)
Physical Therapy Evaluation Patient Details Name: Isabella [name] MRN: [id number] DOB: 29-Dec-1955 Today's Date: 02/13/2015   History of Present Illness  Pt is 59 y/o right handed female s/p left intramedullary retrograde femoral nailing Iafter fall from truck.   Clinical Impression  Patient is s/p above surgery resulting in functional limitations due to the deficits listed below (see PT Problem List).  Patient will benefit from skilled PT to increase their independence and safety with mobility to allow discharge to the venue listed below. Currently anticipating D/C to home following hospital stay but will continue to monitor progress with mobility. Patient reporting limited available assistance at home.      Follow Up Recommendations Home health PT;Supervision for mobility/OOB    Equipment Recommendations  Rolling walker with 5" wheels    Recommendations for Other Services       Precautions / Restrictions Precautions Precautions: Fall Restrictions Weight Bearing Restrictions: Yes LLE Weight Bearing: Non weight bearing      Mobility  Bed Mobility Overal bed mobility: Needs Assistance Bed Mobility: Supine to Sit     Supine to sit: Mod assist;HOB elevated (assist provided with LLE)    General bed mobility comments: slow mobility, encouragement provided throughout. Guarding LLE with transition to sitting.   Transfers Overall transfer level: Needs assistance Equipment used: Rolling walker (2 wheeled) Transfers: Sit to/from [facility] Sit to Stand: Mod assist (cues for hand placement.) Stand pivot transfers: Min assist       General transfer comment: cues provided throughout for technique, precautions, and sequence.   Ambulation/Gait                Stairs            Wheelchair Mobility    Modified Rankin (Stroke Patients Only)       Balance Overall balance assessment: Needs assistance Sitting-balance support: Single extremity  supported;No upper extremity supported Sitting balance-Leahy Scale: Fair     Standing balance support: Bilateral upper extremity supported Standing balance-Leahy Scale: Poor Standing balance comment: using rw                             Pertinent Vitals/Pain Pain Assessment: Faces Pain Score: 10-Worst pain ever Faces Pain Scale: Hurts even more Pain Location: LLE Pain Descriptors / Indicators: Grimacing;Guarding Pain Intervention(s): Limited activity within patient's tolerance;Monitored during session    Home Living Family/patient expects to be discharged to:: Private residence Living Arrangements: Spouse/significant other Available Help at Discharge: Available 24 hours/day;Friend(s);Other (Comment) (uncertain how much help they can actualy provide though. ) Type of Home: House Home Access: Stairs to enter Entrance Stairs-Rails: Psychiatric nurse of Steps: 4 Home Layout: One level Home Equipment: None      Prior Function Level of Independence: Independent               Hand Dominance       Extremity/Trunk Assessment   Upper Extremity Assessment: Defer to OT evaluation           Lower Extremity Assessment: LLE deficits/detail   LLE Deficits / Details: requiring max assist to lift LLE      Communication   Communication: No difficulties  Cognition Arousal/Alertness: Awake/alert Behavior During Therapy: WFL for tasks assessed/performed Overall Cognitive Status: Within Functional Limits for tasks assessed                      General Comments  Exercises        Assessment/Plan    PT Assessment Patient needs continued PT services  PT Diagnosis Difficulty walking;Acute pain   PT Problem List Decreased strength;Decreased range of motion;Decreased activity tolerance;Decreased balance;Decreased mobility;Decreased knowledge of use of DME;Pain  PT Treatment Interventions DME instruction;Gait training;Stair  training;Functional mobility training;Therapeutic activities;Therapeutic exercise;Patient/family education   PT Goals (Current goals can be found in the Care Plan section) Acute Rehab PT Goals Patient Stated Goal: be able to move and walk again PT Goal Formulation: With patient Time For Goal Achievement: 02/27/15 Potential to Achieve Goals: Good    Frequency Min 5X/week   Barriers to discharge        Co-evaluation               End of Session Equipment Utilized During Treatment: Gait belt Activity Tolerance: Patient limited by pain Patient left: in chair;with call bell/phone within reach;Other (comment) (LLE elevated in knee extension) Nurse Communication: Mobility status;Weight bearing status         Time: 3154-0086 PT Time Calculation (min) (ACUTE ONLY): 31 min   Charges:   PT Evaluation $Initial PT Evaluation Tier I: 1 Procedure PT Treatments $Therapeutic Activity: 8-22 mins   PT G Codes:        Cassell Clement, PT, CSCS Pager [phone] Office [phone]  02/13/2015, 1:42 PM

## 2015-02-14 ENCOUNTER — Encounter (HOSPITAL_COMMUNITY): Payer: Self-pay | Admitting: Orthopedic Surgery

## 2015-02-14 DIAGNOSIS — E1169 Type 2 diabetes mellitus with other specified complication: Secondary | ICD-10-CM

## 2015-02-14 DIAGNOSIS — E669 Obesity, unspecified: Secondary | ICD-10-CM

## 2015-02-14 LAB — CBC
HCT: 32 % — ABNORMAL LOW (ref 36.0–46.0)
HEMOGLOBIN: 10.6 g/dL — AB (ref 12.0–15.0)
MCH: 29.9 pg (ref 26.0–34.0)
MCHC: 33.1 g/dL (ref 30.0–36.0)
MCV: 90.1 fL (ref 78.0–100.0)
Platelets: 190 10*3/uL (ref 150–400)
RBC: 3.55 MIL/uL — AB (ref 3.87–5.11)
RDW: 13.2 % (ref 11.5–15.5)
WBC: 11.5 10*3/uL — AB (ref 4.0–10.5)

## 2015-02-14 LAB — GLUCOSE, CAPILLARY
Glucose-Capillary: 160 mg/dL — ABNORMAL HIGH (ref 65–99)
Glucose-Capillary: 138 mg/dL — ABNORMAL HIGH (ref 65–99)
Glucose-Capillary: 120 mg/dL — ABNORMAL HIGH (ref 65–99)

## 2015-02-14 LAB — BASIC METABOLIC PANEL
ANION GAP: 9 (ref 5–15)
BUN: 6 mg/dL (ref 6–20)
CO2: 31 mmol/L (ref 22–32)
Calcium: 8.8 mg/dL — ABNORMAL LOW (ref 8.9–10.3)
Chloride: 97 mmol/L — ABNORMAL LOW (ref 101–111)
Creatinine, Ser: 0.82 mg/dL (ref 0.44–1.00)
GFR calc Af Amer: >60 mL/min (ref >60)
GLUCOSE: 121 mg/dL — AB (ref 65–99)
POTASSIUM: 3.6 mmol/L (ref 3.5–5.1)
Sodium: 137 mmol/L (ref 135–145)

## 2015-02-14 LAB — LIPID PANEL
CHOL/HDL RATIO: 4.2 ratio
CHOLESTEROL: 190 mg/dL (ref 0–200)
HDL: 45 mg/dL (ref >40)
LDL Cholesterol: 123 mg/dL — ABNORMAL HIGH (ref 0–99)
Triglycerides: 110 mg/dL (ref <150)
VLDL: 22 mg/dL (ref 0–40)

## 2015-02-14 MED ORDER — CALCIUM CITRATE 950 (200 CA) MG PO TABS
200.0000 mg | ORAL_TABLET | Freq: Every day | ORAL | Status: DC
  Administered 2015-02-14: 200 mg via ORAL
  Filled 2015-02-14: qty 1

## 2015-02-14 MED ORDER — CALCIUM CARBONATE 1250 (500 CA) MG PO TABS
1.0000 | ORAL_TABLET | Freq: Every day | ORAL | Status: DC
  Administered 2015-02-15: 500 mg via ORAL
  Filled 2015-02-14 (×2): qty 1

## 2015-02-14 NOTE — Progress Notes (Signed)
Physical Therapy Treatment Patient Details Name: Isabella [name] MRN: [id number] DOB: [birth year] Today's Date: 02/14/2015    History of Present Illness Pt is 59 y/o right handed female s/p left intramedullary retrograde femoral nailing after fall from truck. PMHx: Breast CA, MI    PT Comments    Pt with improved transfers and ability to hop short distance today. Pt states she will have assist at home and she is comfortable with WC mobility in home and maintaining pivots until her strength and function improve. Discussed with pt all necessary DME and transport for return home. Pt encouraged to continue HEP throughout the day to increase strength and function. Pt able to perform pericare in standing after toileting.   Follow Up Recommendations  Home health PT;Supervision for mobility/OOB     Equipment Recommendations  Rolling walker with 5" wheels;3in1 (PT);Wheelchair (measurements PT);Wheelchair cushion (measurements PT);Hospital bed;Other (comment) (ambulance transport home)    Recommendations for Other Services       Precautions / Restrictions Precautions Precautions: Fall Restrictions Weight Bearing Restrictions: Yes LLE Weight Bearing: Non weight bearing    Mobility  Bed Mobility Overal bed mobility: Needs Assistance Bed Mobility: Supine to Sit     Supine to sit: Min assist;HOB elevated     General bed mobility comments: pt educated for use of belt to assist LLE to EOB, increased time, HOB 20degrees and sequential cues throughout  Transfers Overall transfer level: Needs assistance   Transfers: Sit to/from Stand Sit to Stand: Mod assist Stand pivot transfers: Min assist       General transfer comment: cues for hand placement, safety and LLE position with foot on P.T. foot to monitor and maintain NWB status. Assist for anterior translation to stand and min assist to pivot bed to [facility]  Ambulation/Gait Ambulation/Gait assistance: Min assist;+2  safety/equipment Ambulation Distance (Feet): 10 Feet Assistive device: Rolling walker (2 wheeled) Gait Pattern/deviations: Step-to pattern   Gait velocity interpretation: Below normal speed for age/gender General Gait Details: cues for maintaining NWB, limited by fatigue, chair to follow   Stairs            Wheelchair Mobility    Modified Rankin (Stroke Patients Only)       Balance Overall balance assessment: Needs assistance   Sitting balance-Leahy Scale: Fair       Standing balance-Leahy Scale: Poor                      Cognition Arousal/Alertness: Awake/alert Behavior During Therapy: WFL for tasks assessed/performed Overall Cognitive Status: Within Functional Limits for tasks assessed                      Exercises General Exercises - Lower Extremity Heel Slides: AAROM;Seated;5 reps;Left Hip ABduction/ADduction: AROM;10 reps;Left;Seated Straight Leg Raises: AAROM;Seated;Left;10 reps    General Comments        Pertinent Vitals/Pain Pain Score: 4  Pain Location: LLE  Pain Descriptors / Indicators: Aching Pain Intervention(s): Monitored during session;Limited activity within patient's tolerance;Repositioned    Home Living                      Prior Function            PT Goals (current goals can now be found in the care plan section) Progress towards PT goals: Progressing toward goals    Frequency  Min 5X/week    PT Plan Current plan remains appropriate    Co-evaluation  End of Session Equipment Utilized During Treatment: Gait belt Activity Tolerance: Patient tolerated treatment well;Patient limited by fatigue Patient left: in chair;with call bell/phone within reach     Time: 0810-0839 PT Time Calculation (min) (ACUTE ONLY): 29 min  Charges:  $Gait Training: 8-22 mins $Therapeutic Activity: 8-22 mins                    G Codes:      Melford Aase February 16, 2015, 8:45 AM Elwyn Reach,  [place]

## 2015-02-14 NOTE — Progress Notes (Signed)
Occupational Therapy Treatment Patient Details Name: Isabella [name] MRN: [id number] DOB: Jul 23, 1955 Today's Date: 02/14/2015    History of present illness Pt is 59 y/o right handed female s/p left intramedullary retrograde femoral nailing after fall from truck. PMHx: Breast CA, MI   OT comments  Very limited treatment due to patient unwilling to practice ADL transfers due to pain and the fact that she had already performed them earlier with nursing and PT. Education provided regarding sponge bathing for now and use of BSC near bed at home. Patient reports her fiance will assist her with bathing/dressing tasks as needed. OT will continue to follow per plan of care.  Follow Up Recommendations  Home health OT;Supervision/Assistance - 24 hour    Equipment Recommendations  3 in 1 bedside comode    Recommendations for Other Services      Precautions / Restrictions Precautions Precautions: Fall Restrictions Weight Bearing Restrictions: Yes LLE Weight Bearing: Non weight bearing       Mobility Bed Mobility Overal bed mobility: Needs Assistance Bed Mobility: Supine to Sit     Supine to sit: Min assist;HOB elevated     General bed mobility comments: pt educated for use of belt to assist LLE to EOB, increased time, HOB 20degrees and sequential cues throughout  Transfers Overall transfer level: Needs assistance   Transfers: Sit to/from Stand Sit to Stand: Mod assist Stand pivot transfers: Min assist       General transfer comment: cues for hand placement, safety and LLE position with foot on P.T. foot to monitor and maintain NWB status. Assist for anterior translation to stand and min assist to pivot bed to [facility]    Balance Overall balance assessment: Needs assistance   Sitting balance-Leahy Scale: Fair       Standing balance-Leahy Scale: Poor                     ADL                                         General ADL Comments: Patient denies  need for AE; states her fiance, who will be there 24/7, will assist with LB self-care. Patient plans to sponge bathe and will have hospital bed/BSC set up in her living room. Patient reports she has already gotten to [facility] twice this morning and does not want to practice right now. She is up in the chair and declines getting out of chair until later when her fiance arrives. OT will continue to follow to address [facility] transfers/toileting prior to discharge home.      Vision                     Perception     Praxis      Cognition   Behavior During Therapy: WFL for tasks assessed/performed Overall Cognitive Status: Within Functional Limits for tasks assessed                       Extremity/Trunk Assessment               Exercises General Exercises - Lower Extremity Heel Slides: AAROM;Seated;5 reps;Left Hip ABduction/ADduction: AROM;10 reps;Left;Seated Straight Leg Raises: AAROM;Seated;Left;10 reps   Shoulder Instructions       General Comments      Pertinent Vitals/ Pain       Pain Assessment:  0-10 Pain Score: 8  Pain Location: LLE Pain Descriptors / Indicators: Aching;Constant;Sore Pain Intervention(s): Limited activity within patient's tolerance;Monitored during session  Home Living                                          Prior Functioning/Environment              Frequency Min 2X/week     Progress Toward Goals  OT Goals(current goals can now be found in the care plan section)  Progress towards OT goals: Progressing toward goals     Plan Discharge plan remains appropriate    Co-evaluation                 End of Session     Activity Tolerance Patient limited by pain   Patient Left in chair;with call bell/phone within reach   Nurse Communication          Time: 9798-9211 OT Time Calculation (min): 10 min  Charges: OT General Charges $OT Visit: 1 Procedure OT Treatments $Self Care/Home Management :  8-22 mins  Bubba Vanbenschoten A 02/14/2015, 9:53 AM

## 2015-02-14 NOTE — Progress Notes (Signed)
Utilization review completed. Jaeanna Mccomber, RN, BSN. 

## 2015-02-14 NOTE — Progress Notes (Signed)
[facility] Progress Note  Subjective  Doing better Sore Working well with PT Tolerating diet + flatus, no BM Voiding w/o difficulty   No other complaints  Review of Systems  Constitutional: Negative for chills.  Respiratory: Negative for shortness of breath and wheezing.   Cardiovascular: Negative for chest pain and palpitations.     Objective   BP 122/65 mmHg  Pulse 78  Temp(Src) 98.3 F (36.8 C) (Oral)  Resp 20  SpO2 97%  Intake/Output      11/02 0701 - 11/03 0700 11/03 0701 - 11/04 0700   P.O. 240    I.V.     IV Piggyback     Total Intake 240     Urine     Blood     Total Output       Net +240          Urine Occurrence 3 x      Labs  Results for CARI, BURGO (MRN [id number]) as of 02/14/2015 09:12  Ref. Range 02/14/2015 05:27  Sodium Latest Ref Range: 135-145 mmol/L 137  Potassium Latest Ref Range: 3.5-5.1 mmol/L 3.6  Chloride Latest Ref Range: 101-111 mmol/L 97 (L)  CO2 Latest Ref Range: 22-32 mmol/L 31  BUN Latest Ref Range: 6-20 mg/dL 6  Creatinine Latest Ref Range: 0.44-1.00 mg/dL 0.82  Calcium Latest Ref Range: 8.9-10.3 mg/dL 8.8 (L)  EGFR (Non-African Amer.) Latest Ref Range: >60 mL/min >60  EGFR (African American) Latest Ref Range: >60 mL/min >60  Glucose Latest Ref Range: 65-99 mg/dL 121 (H)  Anion gap Latest Ref Range: 5-15  9  Cholesterol Latest Ref Range: 0-200 mg/dL 190  Triglycerides Latest Ref Range: <150 mg/dL 110  HDL Cholesterol Latest Ref Range: >40 mg/dL 45  LDL (calc) Latest Ref Range: 0-99 mg/dL 123 (H)  VLDL Latest Ref Range: 0-40 mg/dL 22  Total CHOL/HDL Ratio Latest Units: RATIO 4.2  WBC Latest Ref Range: 4.0-10.5 K/uL 11.5 (H)  RBC Latest Ref Range: 3.87-5.11 MIL/uL 3.55 (L)  Hemoglobin Latest Ref Range: 12.0-15.0 g/dL 10.6 (L)  HCT Latest Ref Range: 36.0-46.0 % 32.0 (L)  MCV Latest Ref Range: 78.0-100.0 fL 90.1  MCH Latest Ref Range: 26.0-34.0 pg 29.9  MCHC Latest Ref Range: 30.0-36.0 g/dL 33.1  RDW  Latest Ref Range: 11.5-15.5 % 13.2  Platelets Latest Ref Range: 150-400 K/uL 190    Exam  Gen: sitting in bedside chair, NAD, appears more comfortable  Ext:       Left Lower Extremity   Dressing c/d/i  Ext warm  + DP pulse  Motor and sensory functions unchanged  Assessment and Plan   POD/HD#: 6   59 year old white female fall off back of truck with left distal third femoral shaft fracture  1. Fall  2. Left distal third femoral shaft fracture s/p IMN             NWB left leg             ROM as tolerated             PT/OT              Dressing change tomorrow                Ice and elevate             No pillows under knee at rest  Place pillows under ankle to elevate leg                          3. Pain management:             percocet, Oxy IR and Robaxin    4. ABL anemia/Hemodynamics              stable   5. Medical issues               Hyperglycemia                           HgbA1c- diagnostic of DM   Appreciate DM coordinator consult   Pt needs to establish care with PCP   Do not plan do dc home on meds at this time   6. DVT/PE prophylaxis:              Lovenox x 3 weeks   7. ID:               periop abx- Ancef  8. Metabolic Bone Disease:             Vitamin D deficiency               PTH shows probable inadequate calcium intake             Will supplement                9. Activity:             per #1  10. FEN/Foley/Lines:             cho mod diet    11. Dispo:            therapies             Plan for dc home tomorrow  DME    Jari Pigg, PA-C [facility] [phone] (O) 02/14/2015 9:11 AM

## 2015-02-15 ENCOUNTER — Encounter (HOSPITAL_COMMUNITY): Payer: Self-pay | Admitting: Orthopedic Surgery

## 2015-02-15 DIAGNOSIS — W19XXXA Unspecified fall, initial encounter: Secondary | ICD-10-CM

## 2015-02-15 DIAGNOSIS — E559 Vitamin D deficiency, unspecified: Secondary | ICD-10-CM

## 2015-02-15 HISTORY — DX: Vitamin D deficiency, unspecified: E55.9

## 2015-02-15 LAB — GLUCOSE, CAPILLARY
GLUCOSE-CAPILLARY: 113 mg/dL — AB (ref 65–99)
Glucose-Capillary: 115 mg/dL — ABNORMAL HIGH (ref 65–99)

## 2015-02-15 LAB — BASIC METABOLIC PANEL
ANION GAP: 9 (ref 5–15)
BUN: 9 mg/dL (ref 6–20)
CO2: 32 mmol/L (ref 22–32)
Calcium: 8.3 mg/dL — ABNORMAL LOW (ref 8.9–10.3)
Chloride: 98 mmol/L — ABNORMAL LOW (ref 101–111)
Creatinine, Ser: 0.82 mg/dL (ref 0.44–1.00)
GFR calc Af Amer: >60 mL/min (ref >60)
GFR calc non Af Amer: >60 mL/min (ref >60)
GLUCOSE: 123 mg/dL — AB (ref 65–99)
POTASSIUM: 3.6 mmol/L (ref 3.5–5.1)
Sodium: 139 mmol/L (ref 135–145)

## 2015-02-15 LAB — VITAMIN D 1,25 DIHYDROXY
VITAMIN D 1, 25 (OH) TOTAL: 60 pg/mL
VITAMIN D3 1, 25 (OH): 60 pg/mL
Vitamin D2 1, 25 (OH)2: <10 pg/mL

## 2015-02-15 MED ORDER — ENOXAPARIN SODIUM 40 MG/0.4ML ~~LOC~~ SOLN: 40.0000 mg | SUBCUTANEOUS | Status: DC

## 2015-02-15 MED ORDER — ENOXAPARIN (LOVENOX) PATIENT EDUCATION KIT
PACK | Freq: Once | Status: AC
  Administered 2015-02-15: 11:00:00
  Filled 2015-02-15: qty 1

## 2015-02-15 MED ORDER — CHOLECALCIFEROL 25 MCG (1000 UT) PO TABS
1000.0000 [IU] | ORAL_TABLET | Freq: Two times a day (BID) | ORAL | Status: DC
Start: 2015-02-15 — End: 2018-01-15

## 2015-02-15 MED ORDER — OXYCODONE-ACETAMINOPHEN 5-325 MG PO TABS: 1.0000 | ORAL_TABLET | Freq: Four times a day (QID) | ORAL | Status: DC | PRN

## 2015-02-15 MED ORDER — CALCIUM CARBONATE 1250 (500 CA) MG PO TABS: 1.0000 | ORAL_TABLET | Freq: Every day | ORAL | Status: DC

## 2015-02-15 MED ORDER — DOCUSATE SODIUM 100 MG PO CAPS: 100.0000 mg | ORAL_CAPSULE | Freq: Two times a day (BID) | ORAL | Status: DC

## 2015-02-15 MED ORDER — OXYCODONE HCL 5 MG PO TABS: 5.0000 mg | ORAL_TABLET | Freq: Four times a day (QID) | ORAL | Status: DC | PRN

## 2015-02-15 MED ORDER — VITAMIN D (ERGOCALCIFEROL) 1.25 MG (50000 UNIT) PO CAPS: 50000.0000 [IU] | ORAL_CAPSULE | ORAL | Status: DC

## 2015-02-15 MED ORDER — ASCORBIC ACID 500 MG PO TABS: 500.0000 mg | ORAL_TABLET | Freq: Every day | ORAL | Status: AC

## 2015-02-15 MED ORDER — METHOCARBAMOL 500 MG PO TABS: 500.0000 mg | ORAL_TABLET | Freq: Four times a day (QID) | ORAL | Status: DC | PRN

## 2015-02-15 NOTE — Progress Notes (Signed)
Physical Therapy Treatment Patient Details Name: Isabella [name] MRN: [id number] DOB: 18-Mar-1956 Today's Date: 02/15/2015    History of Present Illness Pt is 59 y/o right handed female s/p left intramedullary retrograde femoral nailing after fall from truck. PMHx: Breast CA, MI    PT Comments    Pt with improved mobility today, able to move leg OOB with assist of belt and increase gait distance. Pt continues to need guarding and cues with sit to stand transfers to prevent weightbearing on LLE. Increased HEP today and encouraged continued performance throughout the day. Will continue to follow.   Follow Up Recommendations  Home health PT;Supervision for mobility/OOB     Equipment Recommendations  Rolling walker with 5" wheels;3in1 (PT);Wheelchair (measurements PT);Wheelchair cushion (measurements PT);Hospital bed;Other (comment)    Recommendations for Other Services       Precautions / Restrictions Precautions Precautions: Fall Restrictions LLE Weight Bearing: Non weight bearing    Mobility  Bed Mobility Overal bed mobility: Needs Assistance Bed Mobility: Supine to Sit     Supine to sit: Min guard     General bed mobility comments: pt utilized gait belt to move LLE to EOB today, attempted leg lifter and RLE to assist LLE but pt preferred belt, increased time and cues throughout  Transfers Overall transfer level: Needs assistance Equipment used: Rolling walker (2 wheeled) Transfers: Sit to/from Stand Sit to Stand: Min guard Stand pivot transfers: Min guard       General transfer comment: cues for hand placement, safety and LLE position with foot on P.T. foot to monitor and maintain NWB status. Pt utlized RW to hop from bed to [facility]  Ambulation/Gait Ambulation/Gait assistance: Min guard;+2 safety/equipment Ambulation Distance (Feet): 40 Feet Assistive device: Rolling walker (2 wheeled) Gait Pattern/deviations: Step-to pattern   Gait velocity interpretation: Below  normal speed for age/gender General Gait Details: cues for maintaining NWB, limited by fatigue, chair to follow   Stairs            Wheelchair Mobility    Modified Rankin (Stroke Patients Only)       Balance                                    Cognition Arousal/Alertness: Awake/alert Behavior During Therapy: WFL for tasks assessed/performed Overall Cognitive Status: Within Functional Limits for tasks assessed                      Exercises General Exercises - Lower Extremity Long Arc Quad: AAROM;Seated;Left;10 reps Hip ABduction/ADduction: AROM;10 reps;Left;Seated    General Comments        Pertinent Vitals/Pain Pain Assessment: No/denies pain Pain Score: 6  Pain Location: LLe Pain Descriptors / Indicators: Aching Pain Intervention(s): Limited activity within patient's tolerance;Monitored during session;Repositioned    Home Living                      Prior Function            PT Goals (current goals can now be found in the care plan section) Progress towards PT goals: Progressing toward goals    Frequency       PT Plan Current plan remains appropriate    Co-evaluation             End of Session Equipment Utilized During Treatment: Gait belt Activity Tolerance: Patient tolerated treatment well Patient left: in chair;with call  bell/phone within reach     Time: 0831-0858 PT Time Calculation (min) (ACUTE ONLY): 27 min  Charges:  $Gait Training: 8-22 mins                    G Codes:      Melford Aase 2015/03/01, 9:38 AM Elwyn Reach, [place]

## 2015-02-15 NOTE — Progress Notes (Addendum)
Occupational Therapy Treatment Patient Details Name: Isabella [name] MRN: [id number] DOB: 1955/08/24 Today's Date: 02/15/2015    History of present illness Pt is 59 y/o right handed female s/p left intramedullary retrograde femoral nailing after fall from truck. PMHx: Breast CA, MI   OT comments  Pt. Seen for bsc transfer and hygiene with focus on use of BUEs during sit/stand while maintaining wbs.  Pt. Able to return demo, also completed simulated shower stall transfer.  Will continue to follow.    Follow Up Recommendations  Home health OT;Supervision/Assistance - 24 hour    Equipment Recommendations  3 in 1 bedside comode , provided information for gift shop to obtain pricing for gait belt per pt. request   Recommendations for Other Services      Precautions / Restrictions Precautions Precautions: Fall Restrictions LLE Weight Bearing: Non weight bearing       Mobility Bed Mobility               General bed mobility comments: on bsc upon arrival  Transfers Overall transfer level: Needs assistance Equipment used: Rolling walker (2 wheeled) Transfers: Sit to/from [facility] Sit to Stand: Min guard Stand pivot transfers: Min guard, with cues to maintain wbs. Pt. Able to stand pivot to/from bsc. Also amb. To/from b.room with use reg. Height toilet.  pericare completed set up seated.  Pt. Will likely utilize both toileting options at home.         General transfer comment: cues for hand placement, safety and LLE position with foot on P.T. foot to monitor and maintain NWB status.    Balance                                   ADL Overall ADL's : Needs assistance/impaired                         Toilet Transfer: Min guard;BSC;Cueing for safety;Cueing for sequencing Toilet Transfer Details (indicate cue type and reason): cues for maintaining wbs during sit/stands, cues to push through B UES and bring them to RW from arm rests of  BSC. Pt. Able to return demo of safe use of bsc and reg. Height toilet in b.room. Toileting- Clothing Manipulation and Hygiene: Supervision/safety;Sit to/from stand;Cueing for compensatory techniques Toileting - Clothing Manipulation Details (indicate cue type and reason): cues for maintaing wbs during functional mobility  Simulated shower stall transfer with side hopping over small ledge to simulate home env. With min guard A.  Reports dtr. Will be present to assist.         General ADL Comments: noted improvement from previous documentation.  pt. able to return demo of safe bsc transfers and peri care, shower stall transfers, and reg. Height toilet transfers.  Provided information for gift shop as pt. Is interested in gait belt for home use.        Vision                     Perception     Praxis      Cognition   Behavior During Therapy: [facility] for tasks assessed/performed Overall Cognitive Status: Within Functional Limits for tasks assessed                       Extremity/Trunk Assessment  Exercises     Shoulder Instructions       General Comments      Pertinent Vitals/ Pain       Pain Assessment: No/denies pain  Home Living                                          Prior Functioning/Environment              Frequency Min 2X/week     Progress Toward Goals  OT Goals(current goals can now be found in the care plan section)  Progress towards OT goals: Progressing toward goals     Plan Discharge plan remains appropriate    Co-evaluation                 End of Session Equipment Utilized During Treatment: Gait belt;Rolling walker   Activity Tolerance Patient tolerated treatment well   Patient Left Other (comment) (ambulating with PT)   Nurse Communication          Time: 4401-0272 OT Time Calculation (min): 8 min  Charges: OT General Charges $OT Visit: 1 Procedure OT Treatments $Self  Care/Home Management : 8-22 mins  Janice Coffin, COTA/L 02/15/2015, 8:57 AM

## 2015-02-15 NOTE — Discharge Instructions (Signed)
[facility]thopaedic Trauma Service Discharge Instructions,   General Discharge Instructions  WEIGHT BEARING STATUS: Nonweightbearing left leg  RANGE OF MOTION/ACTIVITY: Unrestricted range of motion left hip, knee, ankle. Activity as tolerated while maintaining weight bearing restrictions  Wound Care: Daily dressing changes starting on 02/17/2015. It is okay to shower at this time. Wounds can stay open as long as they are not draining. Please see detailed instructions below for wound care. This is listed under discharge wound care  PAIN MEDICATION USE AND EXPECTATIONS  You have likely been given narcotic medications to help control your pain.  After a traumatic event that results in an fracture (broken bone) with or without surgery, it is ok to use narcotic pain medications to help control one's pain.  We understand that everyone responds to pain differently and each individual patient will be evaluated on a regular basis for the continued need for narcotic medications. Ideally, narcotic medication use should last no more than 6-8 weeks (coinciding with fracture healing).   As a patient it is your responsibility as well to monitor narcotic medication use and report the amount and frequency you use these medications when you come to your office visit.   We would also advise that if you are using narcotic medications, you should take a dose prior to therapy to maximize you participation.  IF YOU ARE ON NARCOTIC MEDICATIONS IT IS NOT PERMISSIBLE TO OPERATE A MOTOR VEHICLE (MOTORCYCLE/CAR/TRUCK/MOPED) OR HEAVY MACHINERY DO NOT MIX NARCOTICS WITH OTHER CNS (CENTRAL NERVOUS SYSTEM) DEPRESSANTS SUCH AS ALCOHOL  Diet: as you were eating previously.  Can use over the counter stool softeners and bowel preparations, such as Miralax, to help with bowel movements.  Narcotics can be constipating.  Be sure to drink plenty of fluids  STOP SMOKING OR USING NICOTINE PRODUCTS!!!!  As discussed nicotine severely impairs  your body's ability to heal surgical and traumatic wounds but also impairs bone healing.  Wounds and bone heal by forming microscopic blood vessels (angiogenesis) and nicotine is a vasoconstrictor (essentially, shrinks blood vessels).  Therefore, if vasoconstriction occurs to these microscopic blood vessels they essentially disappear and are unable to deliver necessary nutrients to the healing tissue.  This is one modifiable factor that you can do to dramatically increase your chances of healing your injury.    (This means no smoking, no nicotine gum, patches, etc)  DO NOT USE NONSTEROIDAL ANTI-INFLAMMATORY DRUGS (NSAID'S)  Using products such as Advil (ibuprofen), Aleve (naproxen), Motrin (ibuprofen) for additional pain control during fracture healing can delay and/or prevent the healing response.  If you would like to take over the counter (OTC) medication, Tylenol (acetaminophen) is ok.  However, some narcotic medications that are given for pain control contain acetaminophen as well. Therefore, you should not exceed more than 4000 mg of tylenol in a day if you do not have liver disease.  Also note that there are may OTC medicines, such as cold medicines and allergy medicines that my contain tylenol as well.  If you have any questions about medications and/or interactions please ask your doctor/PA or your pharmacist.      ICE AND ELEVATE INJURED/OPERATIVE EXTREMITY  Using ice and elevating the injured extremity above your heart can help with swelling and pain control.  Icing in a pulsatile fashion, such as 20 minutes on and 20 minutes off, can be followed.    Do not place ice directly on skin. Make sure there is a barrier between to skin and the ice pack.  Using frozen items such as frozen peas works well as the conform nicely to the are that needs to be iced.  USE AN ACE WRAP OR TED HOSE FOR SWELLING CONTROL  In addition to icing and elevation, Ace wraps or TED hose are used to help limit and  resolve swelling.  It is recommended to use Ace wraps or TED hose until you are informed to stop.    When using Ace Wraps start the wrapping distally (farthest away from the body) and wrap proximally (closer to the body)   Example: If you had surgery on your leg or thing and you do not have a splint on, start the ace wrap at the toes and work your way up to the thigh        If you had surgery on your upper extremity and do not have a splint on, start the ace wrap at your fingers and work your way up to the upper arm  IF YOU ARE IN A SPLINT OR CAST DO NOT [place]   If your splint gets wet for any reason please contact the office immediately. You may shower in your splint or cast as long as you keep it dry.  This can be done by wrapping in a cast cover or garbage back (or similar)  Do Not stick any thing down your splint or cast such as pencils, money, or hangers to try and scratch yourself with.  If you feel itchy take benadryl as prescribed on the bottle for itching  IF YOU ARE IN A CAM BOOT (BLACK BOOT)  You may remove boot periodically. Perform daily dressing changes as noted below.  Wash the liner of the boot regularly and wear a sock when wearing the boot. It is recommended that you sleep in the boot until told otherwise  CALL THE OFFICE WITH ANY QUESTIONS OR CONCERTS: [phone]   Discharge Wound Care Instructions  Do NOT apply any ointments, solutions or lotions to pin sites or surgical wounds.  These prevent needed drainage and even though solutions like hydrogen peroxide kill bacteria, they also damage cells lining the pin sites that help fight infection.  Applying lotions or ointments can keep the wounds moist and can cause them to breakdown and open up as well. This can increase the risk for infection. When in doubt call the office.  Surgical incisions should be dressed daily.  If any drainage is noted, use one layer of adaptic, then gauze, Kerlix, and an ace  wrap.  Once the incision is completely dry and without drainage, it may be left open to air out.  Showering may begin 36-48 hours later.  Cleaning gently with soap and water.  Traumatic wounds should be dressed daily as well.    One layer of adaptic, gauze, Kerlix, then ace wrap.  The adaptic can be discontinued once the draining has ceased    If you have a wet to dry dressing: wet the gauze with saline the squeeze as much saline out so the gauze is moist (not soaking wet), place moistened gauze over wound, then place a dry gauze over the moist one, followed by Kerlix wrap, then ace wrap.

## 2015-02-15 NOTE — Discharge Summary (Signed)
[facility]edic [facility]uma [facility] (OTS)  Patient ID: RHYLEE PUCILLO MRN: [id number] DOB/AGE: 06-12-1955 59 y.o.  Admit date: 02/11/2015 Discharge date: 02/15/2015  Admission Diagnoses: Fall from the back of a truck Closed left supracondylar femur fracture  Discharge Diagnoses:  Principal Problem:   Fracture, femur, supracondylar ([place]), left Active Problems:   Diabetes mellitus type 2 in obese [facility])   Vitamin D deficiency   Fall from truck    Procedures Performed: 02/12/2015- Dr. Marcelino Scot Intramedullary nailing left distal femur fracture- Biomet [place] nail  Discharged Condition: good  Hospital Course:   Patient is a 59 year old white female who is admitted on 02/11/2015 after sustaining a fall off the back of a truck. Please see H&P for full summary. Patient was taken to the operating room on 02/12/2015 where procedure noted above was performed. Patient tolerated surgery well. After surgery she was transferred back to the orthopedic floor for observation and pain control. She was started on Lovenox for T and PE prophylaxis on postoperative day #1 as well. She began to work with therapies on postoperative day #1 and her Foley was discontinued on postoperative day #1. The patient progressed very well during her hospital stay. There were no adverse events or complications noted during her stay. Patient progressed as expected in terms of pain and mobility. She did not have any issues of note. Her pain was controlled with oral and IV pain medications. She was eventually titrated to only oral medications on postoperative day #3. On postoperative day #3 she was deemed to be stable for discharge to home. All necessary DME was obtained for the patient including a hospital bed, wheelchair and walker. She was voiding well and tolerating a diet at the time of discharge.   During workup of her overall bone health it was found that the patient was deficient in vitamin D. She was started on vitamin D  supplementation. In addition her PTH was slightly elevated suggestive that she is not taking enough calcium and as such she was started on calcium supplementation as well.Marland Kitchen She was also found to have a hemoglobin A1c of 6.5% which is in the diagnostic range for diabetes. A diabetic care coordinator consult was obtained. She reviewed at length dietary changes that the patient could make. I did not feel that initiating any kind of pharmacologic therapy is necessary at this point. Patient does need to establish care with a PCP for long-term follow-up. Her diabetes does directly impact her ability to heal her bone given direct effect on collagen. We will recheck her metabolic bone labs in 12 weeks or so.  Consults: None  Significant Diagnostic Studies: labs:  CBG (last 3)   Recent Labs  02/14/15 1633 02/14/15 2229 02/15/15 0626  GLUCAP 138* 120* 115*    Results for SAMUELLA, RASOOL (MRN [id number]) as of 02/15/2015 10:35  Ref. Range 02/15/2015 03:15  Sodium Latest Ref Range: 135-145 mmol/L 139  Potassium Latest Ref Range: 3.5-5.1 mmol/L 3.6  Chloride Latest Ref Range: 101-111 mmol/L 98 (L)  CO2 Latest Ref Range: 22-32 mmol/L 32  BUN Latest Ref Range: 6-20 mg/dL 9  Creatinine Latest Ref Range: 0.44-1.00 mg/dL 0.82  Calcium Latest Ref Range: 8.9-10.3 mg/dL 8.3 (L)  EGFR (Non-African Amer.) Latest Ref Range: >60 mL/min >60  EGFR (African American) Latest Ref Range: >60 mL/min >60  Glucose Latest Ref Range: 65-99 mg/dL 123 (H)  Anion gap Latest Ref Range: 5-15  9   Results for FARRA, NIKOLIC (MRN [id number]) as  of 02/15/2015 10:35  Ref. Range 02/14/2015 05:27  Cholesterol Latest Ref Range: 0-200 mg/dL 190  Triglycerides Latest Ref Range: <150 mg/dL 110  HDL Cholesterol Latest Ref Range: >40 mg/dL 45  LDL (calc) Latest Ref Range: 0-99 mg/dL 123 (H)  VLDL Latest Ref Range: 0-40 mg/dL 22  Total CHOL/HDL Ratio Latest Units: RATIO 4.2  Results for HALIYAH, FRYMAN (MRN [id number]) as of 02/15/2015  10:35  Ref. Range 02/12/2015 09:40  Vit D, 25-Hydroxy Latest Ref Range: 30.0-100.0 ng/mL 14.5 (L)   Results for ARMINTA, GAMM (MRN [id number]) as of 02/15/2015 10:35  Ref. Range 02/12/2015 09:40  Hemoglobin A1C Latest Ref Range: 4.8-5.6 % 6.5 (H)   Results for DAKODA, BASSETTE (MRN [id number]) as of 02/15/2015 10:35  Ref. Range 02/12/2015 09:40  PTH Latest Ref Range: 15-65 pg/mL 53   Results for LEEYA, RUSCONI (MRN [id number]) as of 02/15/2015 10:35  Ref. Range 02/12/2015 09:40  TSH Latest Ref Range: 0.350-4.500 uIU/mL 0.912  Results for MICHAELAH, CREDEUR (MRN [id number]) as of 02/15/2015 10:35  Ref. Range 02/12/2015 09:40  Calcium, Total (PTH) Latest Ref Range: 8.7-10.3 mg/dL 8.6 (L)  Phosphorus Latest Ref Range: 2.5-4.6 mg/dL 3.6  Magnesium Latest Ref Range: 1.7-2.4 mg/dL 1.7  Alkaline Phosphatase Latest Ref Range: 38-126 U/L 90  Albumin Latest Ref Range: 3.5-5.0 g/dL 3.3 (L)  AST Latest Ref Range: 15-41 U/L 26  ALT Latest Ref Range: 14-54 U/L 26  Total Protein Latest Ref Range: 6.5-8.1 g/dL 5.4 (L)  Total Bilirubin Latest Ref Range: 0.3-1.2 mg/dL 0.3  PREALBUMIN Latest Ref Range: 18-38 mg/dL 20.0   Treatments: IV hydration, antibiotics: Ancef, analgesia: Dilaudid, OxyIR, Percocet and Robaxin, anticoagulation: LMW heparin, therapies: PT, OT and RN and surgery: As above  Discharge Exam:                   Orthopaedic Trauma Service (OTS)   Subjective: Patient reports pain as moderate and until today has only mobilized 10 feet.    Objective: Temp:  [98.2 F (36.8 C)-99.6 F (37.6 C)] 98.4 F (36.9 C) (11/04 0555) Pulse Rate:  [78-98] 78 (11/04 0555) Resp:  [20] 20 (11/04 0555) BP: (106-124)/(59-68) 106/59 mmHg (11/04 0555) SpO2:  [92 %-95 %] 94 % (11/04 0555) Physical Exam R&LLEDressing intact, clean, dry             Edema/ swelling controlled             Sens: DPN, SPN, TN intact             Motor: EHL, FHL, and lessor toe ext and flex all intact grossly             Brisk cap  refill              Wounds look fantastic             No drainage or signs of infection             Surgical wounds well sealed   Assessment/Plan: 3 Days Post-Op Procedure(s) (LRB): INTRAMEDULLARY (IM) RETROGRADE FEMORAL NAILING (Left)  Home today Drsg changed today NWB Lovenox  Altamese [place], MD [facility], [facility] [phone] (p)     Disposition: 01-Home or Self Care  Discharge Instructions    Call MD / Call 911    Complete by:  As directed   If you experience chest pain or shortness of breath, CALL 911 and be transported to the hospital emergency room.  If you develope  a fever above 101 F, pus (white drainage) or increased drainage or redness at the wound, or calf pain, call your surgeon's office.     Constipation Prevention    Complete by:  As directed   Drink plenty of fluids.  Prune juice may be helpful.  You may use a stool softener, such as Colace (over the counter) 100 mg twice a day.  Use MiraLax (over the counter) for constipation as needed.     Diet Carb Modified    Complete by:  As directed      Discharge instructions    Complete by:  As directed   Orthopaedic Trauma Service Discharge Instructions,   General Discharge Instructions  WEIGHT BEARING STATUS: Nonweightbearing left leg  RANGE OF MOTION/ACTIVITY: Unrestricted range of motion left hip, knee, ankle. Activity as tolerated while maintaining weight bearing restrictions  Wound Care: Daily dressing changes starting on 02/17/2015. It is okay to shower at this time. Wounds can stay open as long as they are not draining. Please see detailed instructions below for wound care. This is listed under discharge wound care  PAIN MEDICATION USE AND EXPECTATIONS  You have likely been given narcotic medications to help control your pain.  After a traumatic event that results in an fracture (broken bone) with or without surgery, it is ok to use narcotic pain medications to help control  one's pain.  We understand that everyone responds to pain differently and each individual patient will be evaluated on a regular basis for the continued need for narcotic medications. Ideally, narcotic medication use should last no more than 6-8 weeks (coinciding with fracture healing).   As a patient it is your responsibility as well to monitor narcotic medication use and report the amount and frequency you use these medications when you come to your office visit.   We would also advise that if you are using narcotic medications, you should take a dose prior to therapy to maximize you participation.  IF YOU ARE ON NARCOTIC MEDICATIONS IT IS NOT PERMISSIBLE TO OPERATE A MOTOR VEHICLE (MOTORCYCLE/CAR/TRUCK/MOPED) OR HEAVY MACHINERY DO NOT MIX NARCOTICS WITH OTHER CNS (CENTRAL NERVOUS SYSTEM) DEPRESSANTS SUCH AS ALCOHOL  Diet: as you were eating previously.  Can use over the counter stool softeners and bowel preparations, such as Miralax, to help with bowel movements.  Narcotics can be constipating.  Be sure to drink plenty of fluids  STOP SMOKING OR USING NICOTINE PRODUCTS!!!!  As discussed nicotine severely impairs your body's ability to heal surgical and traumatic wounds but also impairs bone healing.  Wounds and bone heal by forming microscopic blood vessels (angiogenesis) and nicotine is a vasoconstrictor (essentially, shrinks blood vessels).  Therefore, if vasoconstriction occurs to these microscopic blood vessels they essentially disappear and are unable to deliver necessary nutrients to the healing tissue.  This is one modifiable factor that you can do to dramatically increase your chances of healing your injury.    (This means no smoking, no nicotine gum, patches, etc)  DO NOT USE NONSTEROIDAL ANTI-INFLAMMATORY DRUGS (NSAID'S)  Using products such as Advil (ibuprofen), Aleve (naproxen), Motrin (ibuprofen) for additional pain control during fracture healing can delay and/or prevent the healing  response.  If you would like to take over the counter (OTC) medication, Tylenol (acetaminophen) is ok.  However, some narcotic medications that are given for pain control contain acetaminophen as well. Therefore, you should not exceed more than 4000 mg of tylenol in a day if you do not have liver disease.  Also note that there are may OTC medicines, such as cold medicines and allergy medicines that my contain tylenol as well.  If you have any questions about medications and/or interactions please ask your doctor/PA or your pharmacist.      ICE AND ELEVATE INJURED/OPERATIVE EXTREMITY  Using ice and elevating the injured extremity above your heart can help with swelling and pain control.  Icing in a pulsatile fashion, such as 20 minutes on and 20 minutes off, can be followed.    Do not place ice directly on skin. Make sure there is a barrier between to skin and the ice pack.    Using frozen items such as frozen peas works well as the conform nicely to the are that needs to be iced.  USE AN ACE WRAP OR TED HOSE FOR SWELLING CONTROL  In addition to icing and elevation, Ace wraps or TED hose are used to help limit and resolve swelling.  It is recommended to use Ace wraps or TED hose until you are informed to stop.    When using Ace Wraps start the wrapping distally (farthest away from the body) and wrap proximally (closer to the body)   Example: If you had surgery on your leg or thing and you do not have a splint on, start the ace wrap at the toes and work your way up to the thigh        If you had surgery on your upper extremity and do not have a splint on, start the ace wrap at your fingers and work your way up to the upper arm  IF YOU ARE IN A SPLINT OR CAST DO NOT [place]   If your splint gets wet for any reason please contact the office immediately. You may shower in your splint or cast as long as you keep it dry.  This can be done by wrapping in a cast cover or garbage back (or  similar)  Do Not stick any thing down your splint or cast such as pencils, money, or hangers to try and scratch yourself with.  If you feel itchy take benadryl as prescribed on the bottle for itching  IF YOU ARE IN A CAM BOOT (BLACK BOOT)  You may remove boot periodically. Perform daily dressing changes as noted below.  Wash the liner of the boot regularly and wear a sock when wearing the boot. It is recommended that you sleep in the boot until told otherwise  CALL THE OFFICE WITH ANY QUESTIONS OR CONCERTS: [phone]   Discharge Wound Care Instructions  Do NOT apply any ointments, solutions or lotions to pin sites or surgical wounds.  These prevent needed drainage and even though solutions like hydrogen peroxide kill bacteria, they also damage cells lining the pin sites that help fight infection.  Applying lotions or ointments can keep the wounds moist and can cause them to breakdown and open up as well. This can increase the risk for infection. When in doubt call the office.  Surgical incisions should be dressed daily.  If any drainage is noted, use one layer of adaptic, then gauze, Kerlix, and an ace wrap.  Once the incision is completely dry and without drainage, it may be left open to air out.  Showering may begin 36-48 hours later.  Cleaning gently with soap and water.  Traumatic wounds should be dressed daily as well.    One layer of adaptic, gauze, Kerlix, then ace wrap.  The adaptic can be  discontinued once the draining has ceased    If you have a wet to dry dressing: wet the gauze with saline the squeeze as much saline out so the gauze is moist (not soaking wet), place moistened gauze over wound, then place a dry gauze over the moist one, followed by Kerlix wrap, then ace wrap.     Do not put a pillow under the knee. Place it under the heel.    Complete by:  As directed      Driving restrictions    Complete by:  As directed   No driving     Increase activity slowly as  tolerated    Complete by:  As directed      Non weight bearing    Complete by:  As directed   Laterality:  left  Extremity:  Lower            Medication List    TAKE these medications        ascorbic acid 500 MG tablet  Commonly known as:  VITAMIN C  Take 1 tablet (500 mg total) by mouth daily.     calcium carbonate 1250 (500 CA) MG tablet  Commonly known as:  OS-CAL - dosed in mg of elemental calcium  Take 1 tablet (500 mg of elemental calcium total) by mouth daily with breakfast.     Cholecalciferol 1000 UNITS tablet  Take 1 tablet (1,000 Units total) by mouth 2 (two) times daily.     docusate sodium 100 MG capsule  Commonly known as:  COLACE  Take 1 capsule (100 mg total) by mouth 2 (two) times daily.     enoxaparin 40 MG/0.4ML injection  Commonly known as:  LOVENOX  Inject 0.4 mLs (40 mg total) into the skin daily.     methocarbamol 500 MG tablet  Commonly known as:  ROBAXIN  Take 1-2 tablets (500-1,000 mg total) by mouth every 6 (six) hours as needed for muscle spasms.     oxyCODONE 5 MG immediate release tablet  Commonly known as:  Oxy IR/ROXICODONE  Take 1-2 tablets (5-10 mg total) by mouth every 6 (six) hours as needed for severe pain or breakthrough pain (Take between Percocet doses for breakthrough pain).     oxyCODONE-acetaminophen 5-325 MG tablet  Commonly known as:  PERCOCET/ROXICET  Take 1-2 tablets by mouth every 6 (six) hours as needed for moderate pain or severe pain.     Vitamin D (Ergocalciferol) 50000 UNITS Caps capsule  Commonly known as:  DRISDOL  Take 1 capsule (50,000 Units total) by mouth every 7 (seven) days.           Follow-up Information    Follow up with HANDY,MICHAEL H, MD. Schedule an appointment as soon as possible for a visit in 2 weeks.   Specialty:  Orthopedic Surgery   Why:  For wound re-check, For suture removal   Contact information:   [address] [place] [address] [phone]       Discharge  Instructions and Plan:  60 year old white female fall off back of truck with left distal third femoral shaft fracture  1. Fall  2. Left distal third femoral shaft fracture s/p IMN             NWB left leg             ROM as tolerated             PT/OT  Dressing changes as needed             Ice and elevate             No pillows under knee at rest                         Place pillows under ankle to elevate leg                          3. Pain management:             percocet, Oxy IR and Robaxin    4. ABL anemia/Hemodynamics              stable   5. Medical issues               Hyperglycemia                           HgbA1c- diagnostic of DM                                                  Pt needs to establish care with PCP                         Do not plan do dc home on meds at this time   6. DVT/PE prophylaxis:              Lovenox x 3 weeks   7. ID:               periop abx- Ancef- completed  8. Metabolic Bone Disease:             Vitamin D deficiency               PTH shows probable inadequate calcium intake             Will supplement                9. Activity:             per #1  10. FEN/Foley/Lines:             cho mod diet    11. Dispo:            therapies             Plan for dc home tomorrow             DME   Follow-up in 2 weeks at office     Signed:  Jari Pigg, PA-C [facility] [phone] (P) 02/15/2015, 10:31 AM

## 2015-02-15 NOTE — Progress Notes (Addendum)
Orthopaedic Trauma Service (OTS)   Subjective: Patient reports pain as moderate and until today has only mobilized 10 feet.    Objective: Temp:  [98.2 F (36.8 C)-99.6 F (37.6 C)] 98.4 F (36.9 C) (11/04 0555) Pulse Rate:  [78-98] 78 (11/04 0555) Resp:  [20] 20 (11/04 0555) BP: (106-124)/(59-68) 106/59 mmHg (11/04 0555) SpO2:  [92 %-95 %] 94 % (11/04 0555) Physical Exam R&LLE Dressing intact, clean, dry  Edema/ swelling controlled  Sens: DPN, SPN, TN intact  Motor: EHL, FHL, and lessor toe ext and flex all intact grossly  Brisk cap refill   Wounds look fantastic  No drainage or signs of infection  Surgical wounds well sealed   Assessment/Plan: 3 Days Post-Op Procedure(s) (LRB): INTRAMEDULLARY (IM) RETROGRADE FEMORAL NAILING (Left)  Home today Drsg changed today NWB Lovenox  Altamese [place], MD [facility] [phone] (p)

## 2015-03-02 NOTE — Op Note (Signed)
NAMETRESSY, ROST                 ACCOUNT NO.:  [id number]  MEDICAL RECORD NO.:  [id number]  LOCATION:  5N01C                        FACILITY:  [facility]  PHYSICIAN:  Astrid Divine. Marcelino Scot, M.D. DATE OF BIRTH:  July 22, 1955  DATE OF PROCEDURE:  02/12/2015 DATE OF DISCHARGE:  02/16/2015                              OPERATIVE REPORT   PREOPERATIVE DIAGNOSIS:  Left femoral shaft fracture.  POSTOPERATIVE DIAGNOSIS:  Left femoral shaft fracture.  PROCEDURE:  Retrograde intramedullary nailing of the left femur using a Biomet 12 x 380 mm statically locked nail.  SURGEON:  Astrid Divine. Marcelino Scot, M.D.  ASSISTANT:  Jari Pigg, PA-C.  ANESTHESIA:  General.  COMPLICATIONS:  None.  DISPOSITION:  To PACU.  CONDITION:  Stable.  BRIEF SUMMARY AND INDICATION FOR PROCEDURE:  Isabella [name] is a very pleasant 59 year old female, who was involved in an injury while unloading a heavy desk from a [place] pickup.  She sustained pain, deformity, and inability to bear weight on the left leg.  Subsequent evaluation demonstrated a distal femoral shaft fracture which was long, oblique, and spiral in nature.  I did discuss with the patient the risks and benefits of surgical repair including possibility of infection, nerve injury, vessel injury, DVT, PE, heart attack, stroke, loss of motion, arthritis, symptomatic hardware, need for further surgery among others.  After full discussion of these risks and others, she did wish to proceed.  BRIEF SUMMARY OF PROCEDURE:  The patient was taken to the operating room.  After administration of preoperative antibiotics, the left lower extremity was prepped and draped in usual sterile fashion.  No tourniquet was used about the thigh.  Reduction was obtained to close measures using 0 bumps and radiolucent triangle with traction supplied by my assistant.  A 2.5-cm incision was made distally along the patellar tendon where a small retinacular incision was made on the medial  side. The threaded guidewire was advanced to the center of the sulcus just anterior to Blumensaat's line and advanced into the center of the distal fragment.  This was followed by the starting reamer and then placement of a ball-tip guidewire up into the center of the hip proximal to the lesser trochanter.  Again, we were careful to watch reduction throughout and control with use of the bumps in addition to directed force manually using a mallet which did not require the assistant.  We sequentially reamed to obtain chatter, measured the length of the nail and inserted to the appropriate depth.  The distal screws were placed off the locking guide.  The 2 proximal screws were placed using perfect circle technique.  Final images showed appropriate placement of hardware, trajectory, and length.  The fracture reduction was excellent and confirmed with orthogonal views as well.  This was followed by engaging the distal locking mechanism within the nail.  All wounds were irrigated thoroughly and closed in similar fashion.  The knee was then examined and found to be stable to varus and valgus force and anterior and posterior stress.  A sterile gently compressive dressing was applied after standard layered closure extending from the foot to the thigh. The patient was awakened from anesthesia and transported to the  PACU in stable condition.  PROGNOSIS:  The patient will be touchdown weightbearing on the left lower extremity with formal DVT prophylaxis.  She will have unrestricted range of motion.  We will plan to see her back in 2 weeks after discharge for removal of her sutures.     Astrid Divine. Marcelino Scot, M.D.     MHH/MEDQ  D:  03/01/2015  T:  03/02/2015  Job:  [id number]

## 2015-07-17 ENCOUNTER — Encounter (HOSPITAL_COMMUNITY): Payer: Self-pay

## 2015-07-17 ENCOUNTER — Emergency Department (HOSPITAL_COMMUNITY): Payer: Medicaid Other

## 2015-07-17 ENCOUNTER — Emergency Department (HOSPITAL_BASED_OUTPATIENT_CLINIC_OR_DEPARTMENT_OTHER)
Admit: 2015-07-17 | Discharge: 2015-07-17 | Disposition: A | Discharge status: Home / Self Care | Payer: Medicaid Other | Attending: Orthopedic Surgery | Admitting: Orthopedic Surgery

## 2015-07-17 ENCOUNTER — Emergency Department (HOSPITAL_COMMUNITY)
Admission: EM | Admit: 2015-07-17 | Discharge: 2015-07-18 | Disposition: A | Discharge status: Home / Self Care | Payer: Medicaid Other | Attending: Emergency Medicine | Admitting: Emergency Medicine

## 2015-07-17 ENCOUNTER — Other Ambulatory Visit: Payer: Self-pay

## 2015-07-17 DIAGNOSIS — E559 Vitamin D deficiency, unspecified: Secondary | ICD-10-CM | POA: Diagnosis not present

## 2015-07-17 DIAGNOSIS — M7989 Other specified soft tissue disorders: Secondary | ICD-10-CM | POA: Diagnosis not present

## 2015-07-17 DIAGNOSIS — R079 Chest pain, unspecified: Secondary | ICD-10-CM | POA: Diagnosis present

## 2015-07-17 DIAGNOSIS — Z8781 Personal history of (healed) traumatic fracture: Secondary | ICD-10-CM | POA: Insufficient documentation

## 2015-07-17 DIAGNOSIS — R0602 Shortness of breath: Secondary | ICD-10-CM | POA: Diagnosis not present

## 2015-07-17 DIAGNOSIS — M549 Dorsalgia, unspecified: Secondary | ICD-10-CM | POA: Diagnosis not present

## 2015-07-17 DIAGNOSIS — Z7901 Long term (current) use of anticoagulants: Secondary | ICD-10-CM | POA: Insufficient documentation

## 2015-07-17 DIAGNOSIS — Z853 Personal history of malignant neoplasm of breast: Secondary | ICD-10-CM | POA: Diagnosis not present

## 2015-07-17 DIAGNOSIS — Z79899 Other long term (current) drug therapy: Secondary | ICD-10-CM | POA: Insufficient documentation

## 2015-07-17 DIAGNOSIS — R609 Edema, unspecified: Secondary | ICD-10-CM

## 2015-07-17 DIAGNOSIS — E119 Type 2 diabetes mellitus without complications: Secondary | ICD-10-CM | POA: Insufficient documentation

## 2015-07-17 LAB — BASIC METABOLIC PANEL
ANION GAP: 9 (ref 5–15)
BUN: 12 mg/dL (ref 6–20)
CHLORIDE: 105 mmol/L (ref 101–111)
CO2: 28 mmol/L (ref 22–32)
Calcium: 9.2 mg/dL (ref 8.9–10.3)
Creatinine, Ser: 1.01 mg/dL — ABNORMAL HIGH (ref 0.44–1.00)
GFR calc non Af Amer: 59 mL/min — ABNORMAL LOW (ref >60)
GLUCOSE: 128 mg/dL — AB (ref 65–99)
POTASSIUM: 4.2 mmol/L (ref 3.5–5.1)
Sodium: 142 mmol/L (ref 135–145)

## 2015-07-17 LAB — CBC
HEMATOCRIT: 39.6 % (ref 36.0–46.0)
HEMOGLOBIN: 13.4 g/dL (ref 12.0–15.0)
MCH: 29.6 pg (ref 26.0–34.0)
MCHC: 33.8 g/dL (ref 30.0–36.0)
MCV: 87.4 fL (ref 78.0–100.0)
Platelets: 235 10*3/uL (ref 150–400)
RBC: 4.53 MIL/uL (ref 3.87–5.11)
RDW: 13.7 % (ref 11.5–15.5)
WBC: 8.3 10*3/uL (ref 4.0–10.5)

## 2015-07-17 LAB — I-STAT TROPONIN, ED
TROPONIN I, POC: 0 ng/mL (ref 0.00–0.08)
Troponin i, poc: 0 ng/mL (ref 0.00–0.08)

## 2015-07-17 LAB — PROTIME-INR
INR: 1.04 (ref 0.00–1.49)
Prothrombin Time: 13.8 seconds (ref 11.6–15.2)

## 2015-07-17 LAB — D-DIMER, QUANTITATIVE (NOT AT ARMC): D DIMER QUANT: 0.4 ug{FEU}/mL (ref 0.00–0.50)

## 2015-07-17 MED ORDER — SODIUM CHLORIDE 0.9 % IV BOLUS (SEPSIS)
1000.0000 mL | Freq: Once | INTRAVENOUS | Status: AC
  Administered 2015-07-17: 1000 mL via INTRAVENOUS

## 2015-07-17 MED ORDER — KETOROLAC TROMETHAMINE 15 MG/ML IJ SOLN
15.0000 mg | Freq: Once | INTRAMUSCULAR | Status: AC
  Administered 2015-07-17: 15 mg via INTRAVENOUS
  Filled 2015-07-17: qty 1

## 2015-07-17 MED ORDER — IOPAMIDOL (ISOVUE-370) INJECTION 76%
INTRAVENOUS | Status: AC
  Administered 2015-07-17: 100 mL
  Filled 2015-07-17: qty 100

## 2015-07-17 NOTE — ED Notes (Addendum)
Patient was sent from Dr. Marcelino Scot for R./O PE and R/O DVT. Patient has had ongoing leg and chest pain that she describes as sharp for several weeks. On arrival patient in no distress. Call Dr. Marcelino Scot with results, patient has cell number

## 2015-07-17 NOTE — ED Notes (Signed)
Handy MD and PA ordered the CT and vascular studies

## 2015-07-17 NOTE — ED Provider Notes (Signed)
CSN: [id number]     Arrival date & time 07/17/15  1725 History   First MD Initiated Contact with Patient 07/17/15 2014     Chief Complaint  Patient presents with  . chst pain R/O PE      (Consider location/radiation/quality/duration/timing/severity/associated sxs/prior Treatment) HPI  60 year old female with a prior history of breast cancer and a femur fracture status post repair in November 2016 presents with leg swelling and chest pain. Patient states the entire left lower extremity, same site as the femur fracture, has been swollen ever since surgery. Her orthopedist, Dr. Marcelino Scot, is concern for a DVT because the swelling has never resolved. She states it has not worsened. States she has had chest pain intermittently every day for the past 1 month. Shortness of breath when the chest pain calms. Is a sharp pain but occasionally dull and achy. Nothing makes the pain better or worse. This includes no exertional component. No change with food. Gets mid back pain in association with chest pain. Patient has been instructed by her orthopedist to start on a baby aspirin. She tried to get into the [facility] but they cannot see her until May 1. Patient was sent here by her orthopedist to rule out blood clot and leg as well as chest.  Past Medical History  Diagnosis Date  . Breast cancer ([place]) 2009    Lumpectomy, chemo and XRT  . Diabetes mellitus type 2 in obese ([place]) 02/14/2015  . Vitamin D deficiency 02/15/2015  . Fracture, femur, supracondylar ([place]), left 02/12/2015  . Diabetes mellitus type 2 in obese ([place]) 02/14/2015   Past Surgical History  Procedure Laterality Date  . Breast lumpectomy Right 2009  . Femur im nail Left 02/12/2015    Procedure: INTRAMEDULLARY (IM) RETROGRADE FEMORAL NAILING;  Surgeon: Altamese Jerome, MD;  Location: [facility];  Service: Orthopedics;  Laterality: Left;   Family History  Problem Relation Age of Onset  . Heart attack     Social History  Substance Use  Topics  . Smoking status: Never Smoker   . Smokeless tobacco: Never Used  . Alcohol Use: No   OB History    No data available     Review of Systems  Constitutional: Negative for fever.  Respiratory: Positive for shortness of breath.   Cardiovascular: Positive for chest pain and leg swelling.  Gastrointestinal: Negative for nausea, vomiting and abdominal pain.  Musculoskeletal: Positive for back pain.  All other systems reviewed and are negative.     Allergies  Review of patient's allergies indicates no known allergies.  Home Medications   Prior to Admission medications   Medication Sig Start Date End Date Taking? Authorizing Provider  calcium carbonate (OS-CAL - DOSED IN MG OF ELEMENTAL CALCIUM) 1250 (500 CA) MG tablet Take 1 tablet (500 mg of elemental calcium total) by mouth daily with breakfast. 02/15/15   Ainsley Spinner, PA-C  cholecalciferol 1000 UNITS tablet Take 1 tablet (1,000 Units total) by mouth 2 (two) times daily. 02/15/15   Ainsley Spinner, PA-C  docusate sodium (COLACE) 100 MG capsule Take 1 capsule (100 mg total) by mouth 2 (two) times daily. 02/15/15   Ainsley Spinner, PA-C  enoxaparin (LOVENOX) 40 MG/0.4ML injection Inject 0.4 mLs (40 mg total) into the skin daily. 02/15/15   Ainsley Spinner, PA-C  methocarbamol (ROBAXIN) 500 MG tablet Take 1-2 tablets (500-1,000 mg total) by mouth every 6 (six) hours as needed for muscle spasms. 02/15/15   Ainsley Spinner, PA-C  oxyCODONE (OXY IR/ROXICODONE) 5  MG immediate release tablet Take 1-2 tablets (5-10 mg total) by mouth every 6 (six) hours as needed for severe pain or breakthrough pain (Take between Percocet doses for breakthrough pain). 02/15/15   Ainsley Spinner, PA-C  oxyCODONE-acetaminophen (PERCOCET/ROXICET) 5-325 MG tablet Take 1-2 tablets by mouth every 6 (six) hours as needed for moderate pain or severe pain. 02/15/15   Ainsley Spinner, PA-C  vitamin C (VITAMIN C) 500 MG tablet Take 1 tablet (500 mg total) by mouth daily. 02/15/15   Ainsley Spinner, PA-C    Vitamin D, Ergocalciferol, (DRISDOL) 50000 UNITS CAPS capsule Take 1 capsule (50,000 Units total) by mouth every 7 (seven) days. 02/15/15   Ainsley Spinner, PA-C   BP 134/80 mmHg  Pulse 76  Temp(Src) 98.8 F (37.1 C) (Oral)  Resp 20  Ht 5\' 5"  (1.651 m)  Wt 226 lb 3.2 oz (102.604 kg)  BMI 37.64 kg/m2  SpO2 98% Physical Exam  Constitutional: She is oriented to person, place, and time. She appears well-developed and well-nourished.  HENT:  Head: Normocephalic and atraumatic.  Right Ear: External ear normal.  Left Ear: External ear normal.  Nose: Nose normal.  Eyes: Right eye exhibits no discharge. Left eye exhibits no discharge.  Cardiovascular: Normal rate, regular rhythm and normal heart sounds.   Pulmonary/Chest: Effort normal and breath sounds normal. She exhibits tenderness.    Abdominal: Soft. There is no tenderness.  Musculoskeletal:  LLE symmetrically swollen compared to right. No pitting edema. No tenderness  Neurological: She is alert and oriented to person, place, and time.  Skin: Skin is warm and dry.  Nursing note and vitals reviewed.   ED Course  Procedures (including critical care time) Labs Review Labs Reviewed  BASIC METABOLIC PANEL - Abnormal; Notable for the following:    Glucose, Bld 128 (*)    Creatinine, Ser 1.01 (*)    GFR calc non Af Amer 59 (*)    All other components within normal limits  CBC  PROTIME-INR  D-DIMER, QUANTITATIVE (NOT AT [facility])  Randolm Idol, ED  Randolm Idol, ED    Imaging Review Dg Chest 2 View  07/17/2015  CLINICAL DATA:  Chest pain for 3 weeks. EXAM: CHEST  2 VIEW COMPARISON:  02/11/2015 FINDINGS: The heart size and mediastinal contours are within normal limits. Both lungs are clear. The visualized skeletal structures are unremarkable. IMPRESSION: No active cardiopulmonary disease. Electronically Signed   By: Van Clines M.D.   On: 07/17/2015 18:50   Ct Angio Chest Pe W/cm &/or Wo Cm  07/18/2015  CLINICAL DATA:   Chest pain and shortness of breath for weeks. EXAM: CT ANGIOGRAPHY CHEST WITH CONTRAST TECHNIQUE: Multidetector CT imaging of the chest was performed using the standard protocol during bolus administration of intravenous contrast. Multiplanar CT image reconstructions and MIPs were obtained to evaluate the vascular anatomy. CONTRAST:  100 mL Isovue 370 IV COMPARISON:  Radiographs earlier this day. FINDINGS: Mediastinum/Lymph Nodes: There are no filling defects within the pulmonary arteries to suggest pulmonary embolus. Thoracic aorta is normal in caliber without evidence of dissection. The heart is normal in size. Coronary artery calcifications are seen. No adenopathy. No pericardial effusion. Lungs/Pleura: No pleural effusion. Right lower lobe pleural-based nodule, image 43 series 6, is unchanged and benign based on stability from chest CT 04/11/2007. No consolidation or mass. No evidence pulmonary edema. Mild central bronchial thickening. Upper abdomen: No acute abnormality. Postsurgical change in the right breast Musculoskeletal: There are no acute or suspicious osseous abnormalities. Review of the MIP  images confirms the above findings. IMPRESSION: 1. No pulmonary embolus. 2. Mild central bronchial thickening. This may be bronchitis or asthma. Otherwise no acute intrathoracic process. Electronically Signed   By: Jeb Levering M.D.   On: 07/18/2015 00:11   VASCULAR LAB PRELIMINARY PRELIMINARY PRELIMINARY PRELIMINARY  Bilateral lower extremity venous duplex completed.   Preliminary report: Bilateral: No evidence of DVT, superficial thrombosis, or Baker's Cyst.   SLAUGHTER, [place], RVS 07/17/2015, 9:22 PM  I have personally reviewed and evaluated these images and lab results as part of my medical decision-making.   EKG Interpretation   Date/Time:  Wednesday July 17 2015 17:59:35 EDT Ventricular Rate:  85 PR Interval:  130 QRS Duration: 78 QT Interval:  362 QTC Calculation: 430 R  Axis:   36 Text Interpretation:  Normal sinus rhythm Cannot rule out Anterior infarct  , age undetermined Abnormal ECG no significant change since Nov 2016  Confirmed by Regenia Skeeter  MD, Kinzlie Harney ([id number]) on 07/17/2015 8:12:05 PM       MDM   Final diagnoses:  Left leg swelling  Chest pain, unspecified chest pain type    Unclear why patient is having her current symptoms. Her work appears unremarkable. She has 2 benign ECGs and 2 negative troponins. I have very low suspicion for ACS given atypical chest pain, symptoms on off for 1 month, no exertional component, and benign workup. She has a history of type 2 diabetes listed in her chart but she states this is borderline and not currently being treated. Orthopedics previously ordered a CT angiogram from the waiting room that is negative for PE. Very low suspicion for dissection. No DVT found in her left lower extremity, unclear why it is chronically swollen. At this point, will discharge and referred to outpatient cardiology as well as refer back to her orthopedist. Discussed return precautions.    Sherwood Gambler, MD 07/18/15 [id number]

## 2015-07-17 NOTE — ED Notes (Signed)
Vascular tech at the bedside.  

## 2015-07-17 NOTE — Progress Notes (Signed)
VASCULAR LAB PRELIMINARY  PRELIMINARY  PRELIMINARY  PRELIMINARY  Bilateral lower extremity venous duplex completed.    Preliminary report:  Bilateral:  No evidence of DVT, superficial thrombosis, or Baker's Cyst.   Isabella [name], RVS 07/17/2015, 9:22 PM

## 2015-07-17 NOTE — ED Notes (Signed)
Attempted IV, on pt without success on left arm, verified with pt if there was any reason that an IV could not be started on the right side, pt said it was ok and has had IVs on the right side in the past.

## 2015-07-17 NOTE — ED Notes (Signed)
Patient was to be in admitting for procedures

## 2015-07-18 NOTE — ED Notes (Signed)
Pt verbalized understanding of discharge instructions. A&Ox4, vital signs stable at discharge.

## 2015-07-19 ENCOUNTER — Other Ambulatory Visit: Payer: Self-pay

## 2015-07-19 DIAGNOSIS — Z1231 Encounter for screening mammogram for malignant neoplasm of breast: Secondary | ICD-10-CM

## 2015-07-24 ENCOUNTER — Encounter: Payer: Self-pay | Admitting: Cardiology

## 2015-07-24 ENCOUNTER — Ambulatory Visit (INDEPENDENT_AMBULATORY_CARE_PROVIDER_SITE_OTHER): Payer: Medicaid Other | Admitting: Cardiology

## 2015-07-24 ENCOUNTER — Other Ambulatory Visit: Payer: Self-pay | Admitting: *Deleted

## 2015-07-24 VITALS — BP 128/88 | HR 70 | Ht 65.0 in | Wt 227.2 lb

## 2015-07-24 DIAGNOSIS — R079 Chest pain, unspecified: Secondary | ICD-10-CM | POA: Diagnosis not present

## 2015-07-24 DIAGNOSIS — Z01818 Encounter for other preprocedural examination: Secondary | ICD-10-CM

## 2015-07-24 DIAGNOSIS — R5383 Other fatigue: Secondary | ICD-10-CM

## 2015-07-24 DIAGNOSIS — D689 Coagulation defect, unspecified: Secondary | ICD-10-CM | POA: Diagnosis not present

## 2015-07-24 DIAGNOSIS — E785 Hyperlipidemia, unspecified: Secondary | ICD-10-CM | POA: Insufficient documentation

## 2015-07-24 DIAGNOSIS — Z853 Personal history of malignant neoplasm of breast: Secondary | ICD-10-CM

## 2015-07-24 DIAGNOSIS — Z8249 Family history of ischemic heart disease and other diseases of the circulatory system: Secondary | ICD-10-CM | POA: Insufficient documentation

## 2015-07-24 LAB — BASIC METABOLIC PANEL
BUN: 11 mg/dL (ref 7–25)
CO2: 28 mmol/L (ref 20–31)
Calcium: 9 mg/dL (ref 8.6–10.4)
Chloride: 104 mmol/L (ref 98–110)
Creat: 0.69 mg/dL (ref 0.50–0.99)
Glucose, Bld: 135 mg/dL — ABNORMAL HIGH (ref 65–99)
Potassium: 3.8 mmol/L (ref 3.5–5.3)
Sodium: 141 mmol/L (ref 135–146)

## 2015-07-24 LAB — CBC
HCT: 39.9 % (ref 35.0–45.0)
Hemoglobin: 13.3 g/dL (ref 11.7–15.5)
MCH: 28.9 pg (ref 27.0–33.0)
MCHC: 33.3 g/dL (ref 32.0–36.0)
MCV: 86.7 fL (ref 80.0–100.0)
MPV: 9.1 fL (ref 7.5–12.5)
Platelets: 240 10*3/uL (ref 140–400)
RBC: 4.6 MIL/uL (ref 3.80–5.10)
RDW: 14.1 % (ref 11.0–15.0)
WBC: 6.8 10*3/uL (ref 3.8–10.8)

## 2015-07-24 LAB — TSH: TSH: 4.68 mIU/L — ABNORMAL HIGH

## 2015-07-24 MED ORDER — METOPROLOL TARTRATE 25 MG PO TABS: 12.5000 mg | ORAL_TABLET | Freq: Two times a day (BID) | ORAL | Status: AC

## 2015-07-24 MED ORDER — AMLODIPINE BESYLATE 2.5 MG PO TABS: 2.5000 mg | ORAL_TABLET | Freq: Every day | ORAL | Status: DC

## 2015-07-24 MED ORDER — NITROGLYCERIN 0.4 MG SL SUBL: 0.4000 mg | SUBLINGUAL_TABLET | SUBLINGUAL | Status: AC | PRN

## 2015-07-24 NOTE — Progress Notes (Signed)
Patient ID: Isabella [name] MRN: [id number], DOB/AGE: 12-01-1955   Admit date: (Not on file)   Primary Physician: No PCP Per Patient Primary Cardiologist: Dr Stanford Breed  HPI:  60 y/o female seen by [place] in Sept 2016 for chest pain. She had a low risk Myoview and normal echo then. In Oct 2016 she fell off a truck on Halloween and fractured her Lt femur. This required intramedullary nailing. She is still using a walker. On 4/5 she was at her orthopedist and complained of chest pain and swelling of her Lt leg. She was sent to [facility] ED and Troponin, EKG, CTA for PE, and LE venous doppler were all unremarkable. She was instructed to f/u with her cardiologist and I am seeing her today in the office.         She continues to complain of chest pain. Sometimes she says its "sharp" sometimes she describes it "squeezing". It's not exertional. It does radiate between her shoulder blades. She denies any associated nausea or vomiting. She did tell me both her brothers-one younger and one a year older- had MI and stents, as well as he father.    Problem List: Past Medical History  Diagnosis Date  . Breast cancer ([place]) 2009    Lumpectomy, chemo and XRT  . Diabetes mellitus type 2 in obese ([place]) 02/14/2015  . Vitamin D deficiency 02/15/2015  . Fracture, femur, supracondylar ([place]), left 02/12/2015  . Diabetes mellitus type 2 in obese ([place]) 02/14/2015    Past Surgical History  Procedure Laterality Date  . Breast lumpectomy Right 2009  . Femur im nail Left 02/12/2015    Procedure: INTRAMEDULLARY (IM) RETROGRADE FEMORAL NAILING;  Surgeon: Altamese [place], MD;  Location: [facility];  Service: Orthopedics;  Laterality: Left;     Allergies: No Known Allergies   Home Medications Prior to Admission medications   Medication Sig Start Date End Date Taking? Authorizing Provider  aspirin EC 81 MG tablet Take 81 mg by mouth daily.   Yes Historical Provider, MD  calcium carbonate (OS-CAL - DOSED IN MG OF ELEMENTAL CALCIUM)  1250 (500 CA) MG tablet Take 1 tablet (500 mg of elemental calcium total) by mouth daily with breakfast. Patient taking differently: Take 1 tablet by mouth 2 (two) times daily with a meal.  02/15/15  Yes Ainsley Spinner, PA-C  cholecalciferol 1000 UNITS tablet Take 1 tablet (1,000 Units total) by mouth 2 (two) times daily. Patient taking differently: Every three days 02/15/15  Yes Ainsley Spinner, PA-C  docusate sodium (COLACE) 100 MG capsule Take 1 capsule (100 mg total) by mouth 2 (two) times daily. 02/15/15  Yes Ainsley Spinner, PA-C  OMEGA-3 FATTY ACIDS PO Take 1 capsule by mouth daily.   Yes Historical Provider, MD  oxyCODONE-acetaminophen (PERCOCET/ROXICET) 5-325 MG tablet Take 1-2 tablets by mouth every 6 (six) hours as needed for moderate pain or severe pain. 02/15/15  Yes Ainsley Spinner, PA-C  VITAMIN A PO Take 1 capsule by mouth daily.   Yes Historical Provider, MD  vitamin C (VITAMIN C) 500 MG tablet Take 1 tablet (500 mg total) by mouth daily. 02/15/15  Yes Ainsley Spinner, PA-C  VITAMIN E PO Take 1 capsule by mouth daily.   Yes Historical Provider, MD  metoprolol tartrate (LOPRESSOR) 25 MG tablet Take 0.5 tablets (12.5 mg total) by mouth 2 (two) times daily. 07/24/15   Erlene Quan, PA-C  nitroGLYCERIN (NITROSTAT) 0.4 MG SL tablet Place 1 tablet (0.4 mg total) under the tongue every  5 (five) minutes as needed for chest pain. 07/24/15   Erlene Quan, PA-C     Family History  Problem Relation Age of Onset  . Heart attack       Social History   Social History  . Marital Status: Divorced    Spouse Name: N/A  . Number of Children: N/A  . Years of Education: N/A   Occupational History  . Not on file.   Social History Main Topics  . Smoking status: Never Smoker   . Smokeless tobacco: Never Used  . Alcohol Use: No  . Drug Use: No  . Sexual Activity: Not on file   Other Topics Concern  . 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  Cardiovascular: negative for chest pain, dyspnea on exertion, edema, orthopnea, palpitations, paroxysmal nocturnal dyspnea or shortness of breath HEENT: negative for any visual disturbances, blindness, glaucoma Dermatological: negative for rash Respiratory: negative for cough, hemoptysis, or wheezing Urologic: negative for hematuria or dysuria Abdominal: negative for nausea, vomiting, diarrhea, bright red blood per rectum, melena, or hematemesis Neurologic: negative for visual changes, syncope, or dizziness Musculoskeletal: negative for back pain, joint pain, or swelling Psych: cooperative and appropriate All other systems reviewed and are otherwise negative except as noted above.  Physical Exam: Blood pressure 128/88, pulse 70, height 5\' 5"  (1.651 m), weight 227 lb 3.2 oz (103.057 kg).  General appearance: alert, cooperative, no distress and moderately obese Neck: no carotid bruit and no JVD Lungs: clear to auscultation bilaterally Heart: regular rate and rhythm Abdomen: obese, non tender Extremities: 1+ edema LLE Pulses: 2+ and symmetric Skin: Skin color, texture, turgor normal. No rashes or lesions Neurologic: Grossly normal    Labs:  No results found for this or any previous visit (from the past 24 hour(s)).   Radiology/Studies: Dg Chest 2 View  07/17/2015  CLINICAL DATA:  Chest pain for 3 weeks. EXAM: CHEST  2 VIEW COMPARISON:  02/11/2015 FINDINGS: The heart size and mediastinal contours are within normal limits. Both lungs are clear. The visualized skeletal structures are unremarkable. IMPRESSION: No active cardiopulmonary disease. Electronically Signed   By: Van Clines M.D.   On: 07/17/2015 18:50   Ct Angio Chest Pe W/cm &/or Wo Cm  07/18/2015  CLINICAL DATA:  Chest pain and shortness of breath for weeks. EXAM: CT ANGIOGRAPHY CHEST WITH CONTRAST TECHNIQUE: Multidetector CT  imaging of the chest was performed using the standard protocol during bolus administration of intravenous contrast. Multiplanar CT image reconstructions and MIPs were obtained to evaluate the vascular anatomy. CONTRAST:  100 mL Isovue 370 IV COMPARISON:  Radiographs earlier this day. FINDINGS: Mediastinum/Lymph Nodes: There are no filling defects within the pulmonary arteries to suggest pulmonary embolus. Thoracic aorta is normal in caliber without evidence of dissection. The heart is normal in size. Coronary artery calcifications are seen. No adenopathy. No pericardial effusion. Lungs/Pleura: No pleural effusion. Right lower lobe pleural-based nodule, image 43 series 6, is unchanged and benign based on stability from chest CT 04/11/2007. No consolidation or mass. No evidence pulmonary edema. Mild central bronchial thickening. Upper abdomen: No acute abnormality. Postsurgical change in the right breast Musculoskeletal: There are no acute or suspicious osseous abnormalities. Review of the MIP images confirms the above findings. IMPRESSION: 1. No pulmonary embolus. 2. Mild central bronchial  thickening. This may be bronchitis or asthma. Otherwise no acute intrathoracic process. Electronically Signed   By: Jeb Levering M.D.   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 Some typical and other atypical features. . Dyslipidemia . Strong family history of coronary artery disease  History of breast cancer- s/p radiation 2009 .  PLAN: Reviewed with Dr Ellyn Hack in the office today. Plan admit for diagnostic cath. Add SL NTG PRN and low dose metoprolol.   The patient understands that risks included but are not limited to stroke (1 in 1000), death (1 in 53), kidney failure [usually temporary] (1 in 500), bleeding (1 in 200), allergic reaction [possibly serious] (1 in 200).  The patient understands and  agrees to proceed.    SignedErlene Quan, PA-C 07/24/2015, 4:59 PM [phone]  I briefly saw the patient today in the clinic. I discussed her presentation as well as the details of her history with Mr. Rosalyn Gess, [place]. She continues to have symptoms which are somewhat atypical, but there are some typical features for angina. She has had a pretty extensive evaluation all of which have not been forthcoming with any convincing data. She has had an evaluation for PE as well as a stress test and echocardiogram. Despite these negative evaluations, she continues to have symptoms. I do agree with the symptoms are concerning for class 2-3 angina. I think the only definitive way to exclude ischemic coronary disease to proceed with cardiac catheterization.  She'll be scheduled for a catheterization at the earliest convenience.   Leonie Man, M.D., M.S. Interventional Cardiologist   Pager # [phone] Phone # [phone] [address] [place], [place] [address]

## 2015-07-24 NOTE — Patient Instructions (Addendum)
Your physician has requested that you have a left cardiac catheterization. Cardiac catheterization is used to diagnose and/or treat various heart conditions. Doctors may recommend this procedure for a number of different reasons. The most common reason is to evaluate chest pain. Chest pain can be a symptom of coronary artery disease (CAD), and cardiac catheterization can show whether plaque is narrowing or blocking your heart's arteries. This procedure is also used to evaluate the valves, as well as measure the blood flow and oxygen levels in different parts of your heart. For further information please visit [facility]. Please follow instruction sheet, as given.  Your physician recommends that you return for lab work   Your physician has recommended you make the following change in your medication: START Metoprolol 12.5 mg twice a day and take Nitroglycerin as needed for chest pain not exceeding 3 tablets

## 2015-07-25 ENCOUNTER — Ambulatory Visit: Payer: Medicaid Other | Admitting: Podiatry

## 2015-07-25 LAB — PROTIME-INR
INR: 0.98 (ref <1.50)
Prothrombin Time: 13.1 seconds (ref 11.6–15.2)

## 2015-07-25 LAB — APTT: aPTT: 30 seconds (ref 24–37)

## 2015-07-29 ENCOUNTER — Ambulatory Visit: Payer: Medicaid Other | Attending: Orthopedic Surgery | Admitting: Physical Therapy

## 2015-07-29 ENCOUNTER — Encounter (HOSPITAL_COMMUNITY)
Admission: RE | Disposition: A | Discharge status: Home / Self Care | Payer: Self-pay | Source: Ambulatory Visit | Attending: Cardiology

## 2015-07-29 ENCOUNTER — Ambulatory Visit (HOSPITAL_COMMUNITY)
Admission: RE | Admit: 2015-07-29 | Discharge: 2015-07-29 | Disposition: A | Discharge status: Home / Self Care | Payer: Medicaid Other | Source: Ambulatory Visit | Attending: Cardiology | Admitting: Cardiology

## 2015-07-29 ENCOUNTER — Encounter (HOSPITAL_COMMUNITY): Payer: Self-pay | Admitting: Cardiology

## 2015-07-29 DIAGNOSIS — Z853 Personal history of malignant neoplasm of breast: Secondary | ICD-10-CM | POA: Insufficient documentation

## 2015-07-29 DIAGNOSIS — I2510 Atherosclerotic heart disease of native coronary artery without angina pectoris: Secondary | ICD-10-CM

## 2015-07-29 DIAGNOSIS — Z8249 Family history of ischemic heart disease and other diseases of the circulatory system: Secondary | ICD-10-CM

## 2015-07-29 DIAGNOSIS — R079 Chest pain, unspecified: Secondary | ICD-10-CM | POA: Diagnosis not present

## 2015-07-29 DIAGNOSIS — Z7982 Long term (current) use of aspirin: Secondary | ICD-10-CM | POA: Diagnosis not present

## 2015-07-29 DIAGNOSIS — E1169 Type 2 diabetes mellitus with other specified complication: Secondary | ICD-10-CM

## 2015-07-29 DIAGNOSIS — Z79899 Other long term (current) drug therapy: Secondary | ICD-10-CM | POA: Diagnosis not present

## 2015-07-29 DIAGNOSIS — E119 Type 2 diabetes mellitus without complications: Secondary | ICD-10-CM | POA: Diagnosis not present

## 2015-07-29 DIAGNOSIS — E669 Obesity, unspecified: Secondary | ICD-10-CM | POA: Diagnosis not present

## 2015-07-29 DIAGNOSIS — Z6837 Body mass index (BMI) 37.0-37.9, adult: Secondary | ICD-10-CM | POA: Insufficient documentation

## 2015-07-29 DIAGNOSIS — E785 Hyperlipidemia, unspecified: Secondary | ICD-10-CM | POA: Diagnosis not present

## 2015-07-29 DIAGNOSIS — Z01818 Encounter for other preprocedural examination: Secondary | ICD-10-CM

## 2015-07-29 HISTORY — PX: CARDIAC CATHETERIZATION: SHX172

## 2015-07-29 LAB — GLUCOSE, CAPILLARY: Glucose-Capillary: 117 mg/dL — ABNORMAL HIGH (ref 65–99)

## 2015-07-29 SURGERY — LEFT HEART CATH AND CORS/GRAFTS ANGIOGRAPHY
Anesthesia: LOCAL

## 2015-07-29 MED ORDER — VERAPAMIL HCL 2.5 MG/ML IV SOLN
INTRAVENOUS | Status: AC
  Filled 2015-07-29: qty 2

## 2015-07-29 MED ORDER — SODIUM CHLORIDE 0.9% FLUSH: 3.0000 mL | INTRAVENOUS | Status: DC | PRN

## 2015-07-29 MED ORDER — FENTANYL CITRATE (PF) 100 MCG/2ML IJ SOLN
INTRAMUSCULAR | Status: DC | PRN
  Administered 2015-07-29: 25 ug via INTRAVENOUS

## 2015-07-29 MED ORDER — FENTANYL CITRATE (PF) 100 MCG/2ML IJ SOLN
INTRAMUSCULAR | Status: AC
  Filled 2015-07-29: qty 2

## 2015-07-29 MED ORDER — NITROGLYCERIN 0.4 MG SL SUBL
0.4000 mg | SUBLINGUAL_TABLET | SUBLINGUAL | Status: DC | PRN
Start: 2015-07-29 — End: 2015-07-29
  Administered 2015-07-29: 0.4 mg via SUBLINGUAL

## 2015-07-29 MED ORDER — NITROGLYCERIN 0.4 MG SL SUBL
SUBLINGUAL_TABLET | SUBLINGUAL | Status: AC
  Filled 2015-07-29: qty 1

## 2015-07-29 MED ORDER — MIDAZOLAM HCL 2 MG/2ML IJ SOLN
INTRAMUSCULAR | Status: AC
  Filled 2015-07-29: qty 2

## 2015-07-29 MED ORDER — HEPARIN (PORCINE) IN NACL 2-0.9 UNIT/ML-% IJ SOLN
INTRAMUSCULAR | Status: AC
  Filled 2015-07-29: qty 1500

## 2015-07-29 MED ORDER — HEPARIN SODIUM (PORCINE) 1000 UNIT/ML IJ SOLN
INTRAMUSCULAR | Status: AC
  Filled 2015-07-29: qty 1

## 2015-07-29 MED ORDER — MIDAZOLAM HCL 2 MG/2ML IJ SOLN
INTRAMUSCULAR | Status: DC | PRN
  Administered 2015-07-29: 2 mg via INTRAVENOUS

## 2015-07-29 MED ORDER — ACETAMINOPHEN 325 MG PO TABS: 650.0000 mg | ORAL_TABLET | ORAL | Status: DC | PRN

## 2015-07-29 MED ORDER — LIDOCAINE HCL (PF) 1 % IJ SOLN
INTRAMUSCULAR | Status: AC
  Filled 2015-07-29: qty 30

## 2015-07-29 MED ORDER — SODIUM CHLORIDE 0.9% FLUSH: 3.0000 mL | Freq: Two times a day (BID) | INTRAVENOUS | Status: DC

## 2015-07-29 MED ORDER — ASPIRIN 81 MG PO CHEW: 81.0000 mg | CHEWABLE_TABLET | ORAL | Status: DC

## 2015-07-29 MED ORDER — SODIUM CHLORIDE 0.9 % IV SOLN: INTRAVENOUS | Status: DC

## 2015-07-29 MED ORDER — VERAPAMIL HCL 2.5 MG/ML IV SOLN
INTRAVENOUS | Status: DC | PRN
  Administered 2015-07-29: 10 mL via INTRA_ARTERIAL

## 2015-07-29 MED ORDER — HEPARIN SODIUM (PORCINE) 1000 UNIT/ML IJ SOLN
INTRAMUSCULAR | Status: DC | PRN
  Administered 2015-07-29: 5000 [IU] via INTRAVENOUS

## 2015-07-29 MED ORDER — ONDANSETRON HCL 4 MG/2ML IJ SOLN: 4.0000 mg | Freq: Four times a day (QID) | INTRAMUSCULAR | Status: DC | PRN

## 2015-07-29 MED ORDER — SODIUM CHLORIDE 0.9 % WEIGHT BASED INFUSION: 3.0000 mL/kg/h | INTRAVENOUS | Status: DC

## 2015-07-29 MED ORDER — HEPARIN (PORCINE) IN NACL 2-0.9 UNIT/ML-% IJ SOLN
INTRAMUSCULAR | Status: DC | PRN
  Administered 2015-07-29: 1500 mL

## 2015-07-29 MED ORDER — LIDOCAINE HCL (PF) 1 % IJ SOLN
INTRAMUSCULAR | Status: DC | PRN
  Administered 2015-07-29: 2 mL via INTRADERMAL

## 2015-07-29 MED ORDER — IOPAMIDOL (ISOVUE-370) INJECTION 76%
INTRAVENOUS | Status: AC
  Filled 2015-07-29: qty 100

## 2015-07-29 MED ORDER — SODIUM CHLORIDE 0.9 % IV SOLN: 250.0000 mL | INTRAVENOUS | Status: DC | PRN

## 2015-07-29 SURGICAL SUPPLY — 9 items
CATH OPTITORQUE TIG 4.0 5F (CATHETERS) ×1 IMPLANT
DEVICE RAD COMP TR BAND LRG (VASCULAR PRODUCTS) ×1 IMPLANT
GLIDESHEATH SLEND A-KIT 6F 22G (SHEATH) ×1 IMPLANT
KIT HEART LEFT (KITS) ×2 IMPLANT
PACK CARDIAC CATHETERIZATION (CUSTOM PROCEDURE TRAY) ×2 IMPLANT
SYR MEDRAD MARK V 150ML (SYRINGE) ×2 IMPLANT
TRANSDUCER W/STOPCOCK (MISCELLANEOUS) ×2 IMPLANT
TUBING CIL FLEX 10 FLL-RA (TUBING) ×2 IMPLANT
WIRE SAFE-T 1.5MM-J .035X260CM (WIRE) ×1 IMPLANT

## 2015-07-29 NOTE — Research (Signed)
CAD LAD Informed Consent   Subject Name: DRUANNE BOSQUES  Subject met inclusion and exclusion criteria.  The informed consent form, study requirements and expectations were reviewed with the subject and questions and concerns were addressed prior to the signing of the consent form.  The subject verbalized understanding of the trail requirements.  The subject agreed to participate in the CAd LAD trial and signed the informed consent.  The informed consent was obtained prior to performance of any protocol-specific procedures for the subject.  A copy of the signed informed consent was given to the subject and a copy was placed in the subject's medical record.  Hedrick,Yena Tisby W 07/29/2015, [id number]

## 2015-07-29 NOTE — Interval H&P Note (Signed)
History and Physical Interval Note:  07/29/2015 9:19 AM  Isabella [name]  has presented today for surgery, with the diagnosis of chest pain with moderate to high risk of cardiac etiology.   The patient has had persistent chest pain that has typical and atypical features for progressive angina despite having had a negative evaluation with Myoview recently. She continues to have episodes of chest pain to confirm or deny presence of coronary disease.   The various methods of treatment have been discussed with the patient and family. After consideration of risks, benefits and other options for treatment, the patient has consented to  Procedure(s): Left Heart Cath and Cors/Grafts Angiography (N/A) With Possible Percutaneous Coronary Intervention as a surgical intervention .  The patient's history has been reviewed, patient examined, no change in status, stable for surgery.  I have reviewed the patient's chart and labs.  Questions were answered to the patient's satisfaction.    Appropriateness of Use for Diagnostic Catheterization CAD Assessment (Coronary Angiography With or Without Left Heart Catheterization and/or Left Ventriculography)  Patient Information:   Suspected CAD (No Prior PCI, No Prior CABG, and No Prior Angiogram Showing >= 50% Angiographic Stenosis)   Prior Noninvasive Testing: Stress Test With Imaging (SPECT MPI, Stress Echocardiography, Stress PET, Stress CMR)   Low-risk findings (e.g., 5% ischemic myocardium on stress SPECT MPI or stress PET, no stress-induced wall motion abnormalities on stress echo or stress CMR)   Pretest Symptom Status: Symptomatic  AUC Score:   U (4)   Indication:   15 CAD Assessment (Coronary Angiography With or Without Left Heart Catheterization and/or Left Ventriculography)  Patient Information:    Suspected CAD (No Prior PCI, No Prior CABG, and No Prior Angiogram Showing >= 50% Angiographic Stenosis)   Prior Noninvasive Testing: Stress Test With  Imaging (SPECT MPI, Stress Echocardiography, Stress PET, Stress CMR)   Discordant findings (e.g., low-risk prior imaging with ongoing symptoms consistent with ischemic equivalent)   Pretest Symptom Status: Symptomatic     AUC Score:   A (7)   Indication:   19  Appropriateness use for PCI pending results of diagnostic catheterization  Isabella [name]

## 2015-07-29 NOTE — H&P (View-Only) (Signed)
Patient ID: MONTESSA GARSKE MRN: [id number], DOB/AGE: [birth year]   Admit date: (Not on file)   Primary Physician: No PCP Per Patient Primary Cardiologist: Dr Stanford Breed  HPI:  60 y/o female seen by [place] in Sept 2016 for chest pain. She had a low risk Myoview and normal echo then. In Oct 2016 she fell off a truck on Halloween and fractured her Lt femur. This required intramedullary nailing. She is still using a walker. On 4/5 she was at her orthopedist and complained of chest pain and swelling of her Lt leg. She was sent to [facility] ED and Troponin, EKG, CTA for PE, and LE venous doppler were all unremarkable. She was instructed to f/u with her cardiologist and I am seeing her today in the office.         She continues to complain of chest pain. Sometimes she says its "sharp" sometimes she describes it "squeezing". It's not exertional. It does radiate between her shoulder blades. She denies any associated nausea or vomiting. She did tell me both her brothers-one younger and one a year older- had MI and stents, as well as he father.    Problem List: Past Medical History  Diagnosis Date  . Breast cancer ([place]) 2009    Lumpectomy, chemo and XRT  . Diabetes mellitus type 2 in obese ([place]) 02/14/2015  . Vitamin D deficiency 02/15/2015  . Fracture, femur, supracondylar ([place]), left 02/12/2015  . Diabetes mellitus type 2 in obese ([place]) 02/14/2015    Past Surgical History  Procedure Laterality Date  . Breast lumpectomy Right 2009  . Femur im nail Left 02/12/2015    Procedure: INTRAMEDULLARY (IM) RETROGRADE FEMORAL NAILING;  Surgeon: Altamese [place], MD;  Location: [facility];  Service: Orthopedics;  Laterality: Left;     Allergies: No Known Allergies   Home Medications Prior to Admission medications   Medication Sig Start Date End Date Taking? Authorizing Provider  aspirin EC 81 MG tablet Take 81 mg by mouth daily.   Yes Historical Provider, MD  calcium carbonate (OS-CAL - DOSED IN MG OF ELEMENTAL CALCIUM)  1250 (500 CA) MG tablet Take 1 tablet (500 mg of elemental calcium total) by mouth daily with breakfast. Patient taking differently: Take 1 tablet by mouth 2 (two) times daily with a meal.  02/15/15  Yes Ainsley Spinner, PA-C  cholecalciferol 1000 UNITS tablet Take 1 tablet (1,000 Units total) by mouth 2 (two) times daily. Patient taking differently: Every three days 02/15/15  Yes Ainsley Spinner, PA-C  docusate sodium (COLACE) 100 MG capsule Take 1 capsule (100 mg total) by mouth 2 (two) times daily. 02/15/15  Yes Ainsley Spinner, PA-C  OMEGA-3 FATTY ACIDS PO Take 1 capsule by mouth daily.   Yes Historical Provider, MD  oxyCODONE-acetaminophen (PERCOCET/ROXICET) 5-325 MG tablet Take 1-2 tablets by mouth every 6 (six) hours as needed for moderate pain or severe pain. 02/15/15  Yes Ainsley Spinner, PA-C  VITAMIN A PO Take 1 capsule by mouth daily.   Yes Historical Provider, MD  vitamin C (VITAMIN C) 500 MG tablet Take 1 tablet (500 mg total) by mouth daily. 02/15/15  Yes Ainsley Spinner, PA-C  VITAMIN E PO Take 1 capsule by mouth daily.   Yes Historical Provider, MD  metoprolol tartrate (LOPRESSOR) 25 MG tablet Take 0.5 tablets (12.5 mg total) by mouth 2 (two) times daily. 07/24/15   Erlene Quan, PA-C  nitroGLYCERIN (NITROSTAT) 0.4 MG SL tablet Place 1 tablet (0.4 mg total) under the tongue every  5 (five) minutes as needed for chest pain. 07/24/15   Erlene Quan, PA-C     Family History  Problem Relation Age of Onset  . Heart attack       Social History   Social History  . Marital Status: Divorced    Spouse Name: N/A  . Number of Children: N/A  . Years of Education: N/A   Occupational History  . Not on file.   Social History Main Topics  . Smoking status: Never Smoker   . Smokeless tobacco: Never Used  . Alcohol Use: No  . Drug Use: No  . Sexual Activity: Not on file   Other Topics Concern  . 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  Cardiovascular: negative for chest pain, dyspnea on exertion, edema, orthopnea, palpitations, paroxysmal nocturnal dyspnea or shortness of breath HEENT: negative for any visual disturbances, blindness, glaucoma Dermatological: negative for rash Respiratory: negative for cough, hemoptysis, or wheezing Urologic: negative for hematuria or dysuria Abdominal: negative for nausea, vomiting, diarrhea, bright red blood per rectum, melena, or hematemesis Neurologic: negative for visual changes, syncope, or dizziness Musculoskeletal: negative for back pain, joint pain, or swelling Psych: cooperative and appropriate All other systems reviewed and are otherwise negative except as noted above.  Physical Exam: Blood pressure 128/88, pulse 70, height 5\' 5"  (1.651 m), weight 227 lb 3.2 oz (103.057 kg).  General appearance: alert, cooperative, no distress and moderately obese Neck: no carotid bruit and no JVD Lungs: clear to auscultation bilaterally Heart: regular rate and rhythm Abdomen: obese, non tender Extremities: 1+ edema LLE Pulses: 2+ and symmetric Skin: Skin color, texture, turgor normal. No rashes or lesions Neurologic: Grossly normal    Labs:  No results found for this or any previous visit (from the past 24 hour(s)).   Radiology/Studies: Dg Chest 2 View  07/17/2015  CLINICAL DATA:  Chest pain for 3 weeks. EXAM: CHEST  2 VIEW COMPARISON:  02/11/2015 FINDINGS: The heart size and mediastinal contours are within normal limits. Both lungs are clear. The visualized skeletal structures are unremarkable. IMPRESSION: No active cardiopulmonary disease. Electronically Signed   By: Van Clines M.D.   On: 07/17/2015 18:50   Ct Angio Chest Pe W/cm &/or Wo Cm  07/18/2015  CLINICAL DATA:  Chest pain and shortness of breath for weeks. EXAM: CT ANGIOGRAPHY CHEST WITH CONTRAST TECHNIQUE: Multidetector CT  imaging of the chest was performed using the standard protocol during bolus administration of intravenous contrast. Multiplanar CT image reconstructions and MIPs were obtained to evaluate the vascular anatomy. CONTRAST:  100 mL Isovue 370 IV COMPARISON:  Radiographs earlier this day. FINDINGS: Mediastinum/Lymph Nodes: There are no filling defects within the pulmonary arteries to suggest pulmonary embolus. Thoracic aorta is normal in caliber without evidence of dissection. The heart is normal in size. Coronary artery calcifications are seen. No adenopathy. No pericardial effusion. Lungs/Pleura: No pleural effusion. Right lower lobe pleural-based nodule, image 43 series 6, is unchanged and benign based on stability from chest CT 04/11/2007. No consolidation or mass. No evidence pulmonary edema. Mild central bronchial thickening. Upper abdomen: No acute abnormality. Postsurgical change in the right breast Musculoskeletal: There are no acute or suspicious osseous abnormalities. Review of the MIP images confirms the above findings. IMPRESSION: 1. No pulmonary embolus. 2. Mild central  bronchial thickening. This may be bronchitis or asthma. Otherwise no acute intrathoracic process. Electronically Signed   By: Jeb Levering M.D.   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 Some typical and other atypical features. . Dyslipidemia . Strong family history of coronary artery disease  History of breast cancer- s/p radiation 2009 .  PLAN: Reviewed with Dr Ellyn Hack in the office today. Plan admit for diagnostic cath. Add SL NTG PRN and low dose metoprolol.   The patient understands that risks included but are not limited to stroke (1 in 1000), death (1 in 15), kidney failure [usually temporary] (1 in 500), bleeding (1 in 200), allergic reaction [possibly serious] (1 in 200).  The patient understands and  agrees to proceed.    SignedErlene Quan, PA-C 07/24/2015, 4:59 PM [phone]  I briefly saw the patient today in the clinic. I discussed her presentation as well as the details of her history with Mr. Rosalyn Gess, [place]. She continues to have symptoms which are somewhat atypical, but there are some typical features for angina. She has had a pretty extensive evaluation all of which have not been forthcoming with any convincing data. She has had an evaluation for PE as well as a stress test and echocardiogram. Despite these negative evaluations, she continues to have symptoms. I do agree with the symptoms are concerning for class 2-3 angina. I think the only definitive way to exclude ischemic coronary disease to proceed with cardiac catheterization.  She'll be scheduled for a catheterization at the earliest convenience.   Leonie Man, M.D., M.S. Interventional Cardiologist   Pager # [phone] Phone # [phone] [address] [place], [place] [address]

## 2015-07-29 NOTE — Discharge Instructions (Signed)
Radial Site Care °Refer to this sheet in the next few weeks. These instructions provide you with information about caring for yourself after your procedure. Your health care provider may also give you more specific instructions. Your treatment has been planned according to current medical practices, but problems sometimes occur. Call your health care provider if you have any problems or questions after your procedure. °WHAT TO EXPECT AFTER THE PROCEDURE °After your procedure, it is typical to have the following: °· Bruising at the radial site that usually fades within 1-2 weeks. °· Blood collecting in the tissue (hematoma) that may be painful to the touch. It should usually decrease in size and tenderness within 1-2 weeks. °HOME CARE INSTRUCTIONS °· Take medicines only as directed by your health care provider. °· You may shower 24-48 hours after the procedure or as directed by your health care provider. Remove the bandage (dressing) and gently wash the site with plain soap and water. Pat the area dry with a clean towel. Do not rub the site, because this may cause bleeding. °· Do not take baths, swim, or use a hot tub until your health care provider approves. °· Check your insertion site every day for redness, swelling, or drainage. °· Do not apply powder or lotion to the site. °· Do not flex or bend the affected arm for 24 hours or as directed by your health care provider. °· Do not push or pull heavy objects with the affected arm for 24 hours or as directed by your health care provider. °· Do not lift over 10 lb (4.5 kg) for 5 days after your procedure or as directed by your health care provider. °· Ask your health care provider when it is okay to: °¨ Return to work or school. °¨ Resume usual physical activities or sports. °¨ Resume sexual activity. °· Do not drive home if you are discharged the same day as the procedure. Have someone else drive you. °· You may drive 24 hours after the procedure unless otherwise  instructed by your health care provider. °· Do not operate machinery or power tools for 24 hours after the procedure. °· If your procedure was done as an outpatient procedure, which means that you went home the same day as your procedure, a responsible adult should be with you for the first 24 hours after you arrive home. °· Keep all follow-up visits as directed by your health care provider. This is important. °SEEK MEDICAL CARE IF: °· You have a fever. °· You have chills. °· You have increased bleeding from the radial site. Hold pressure on the site and call 911. °SEEK IMMEDIATE MEDICAL CARE IF: °· You have unusual pain at the radial site. °· You have redness, warmth, or swelling at the radial site. °· You have drainage (other than a small amount of blood on the dressing) from the radial site. °· The radial site is bleeding, and the bleeding does not stop after 30 minutes of holding steady pressure on the site. °· Your arm or hand becomes pale, cool, tingly, or numb. °  °This information is not intended to replace advice given to you by your health care provider. Make sure you discuss any questions you have with your health care provider. °  °Document Released: 05/02/2010 Document Revised: 04/20/2014 Document Reviewed: 10/16/2013 °Elsevier Interactive Patient Education ©2016 Elsevier Inc. ° °

## 2015-08-09 ENCOUNTER — Inpatient Hospital Stay: Admission: RE | Admit: 2015-08-09 | Payer: Medicaid Other | Source: Ambulatory Visit

## 2015-08-14 ENCOUNTER — Ambulatory Visit: Payer: Medicaid Other | Attending: Orthopedic Surgery

## 2015-08-14 DIAGNOSIS — R262 Difficulty in walking, not elsewhere classified: Secondary | ICD-10-CM | POA: Diagnosis present

## 2015-08-14 DIAGNOSIS — M6281 Muscle weakness (generalized): Secondary | ICD-10-CM | POA: Diagnosis present

## 2015-08-14 DIAGNOSIS — M25562 Pain in left knee: Secondary | ICD-10-CM | POA: Diagnosis present

## 2015-08-14 NOTE — Therapy (Addendum)
[name], [place], [address] Phone: [phone]   Fax:  [phone]  Physical Therapy Evaluation/ Discharge  Patient Details  Name: STEPHENIE [name] MRN: [id number] Date of Birth: 06/08/1955 Referring Provider: Crawford Givens, MD  Encounter Date: 08/14/2015      PT End of Session - 08/14/15 1211    Visit Number 1   Number of Visits 4   PT Start Time 1203  pt late for appointment   PT Stop Time 1233   PT Time Calculation (min) 30 min      Past Medical History  Diagnosis Date  . Breast cancer ([place]) 2009    Lumpectomy, chemo and XRT  . Diabetes mellitus type 2 in obese ([place]) 02/14/2015  . Vitamin D deficiency 02/15/2015  . Fracture, femur, supracondylar ([place]), left 02/12/2015  . Diabetes mellitus type 2 in obese ([place]) 02/14/2015    Past Surgical History  Procedure Laterality Date  . Breast lumpectomy Right 2009  . Femur im nail Left 02/12/2015    Procedure: INTRAMEDULLARY (IM) RETROGRADE FEMORAL NAILING;  Surgeon: Altamese [place], MD;  Location: [facility];  Service: Orthopedics;  Laterality: Left;  . Cardiac catheterization N/A 07/29/2015    Procedure: Left Heart Cath and Cors/Grafts Angiography;  Surgeon: Leonie Man, MD;  Location: [facility];  Service: Cardiovascular;  Laterality: N/A;    There were no vitals filed for this visit.       Subjective Assessment - 08/14/15 1207    Subjective She report femur fracture getting off truck an foot slide and did split.  Now post ORIf IM nail LT femur.  She researchd for exercise and is doing home exercxies with band s and ball.    She continues to walk with RW now.    Limitations Walking;Standing;House hold activities   Currently in Pain? Yes   Pain Score 6   stays at 5-6/10   Pain Location Knee   Pain Orientation Left   Pain Descriptors / Indicators Tightness  restricted   Pain Type Chronic pain   Pain Onset More than a month ago   Pain Frequency  Constant  infrequent times without pain   Aggravating Factors  moving leg, doing normal chores   Pain Relieving Factors heat /cold, medication   Multiple Pain Sites No            [facility] PT Assessment - 08/14/15 0001    Assessment   Medical Diagnosis IM nail LT femur   Referring Provider Crawford Givens, MD   Prior Therapy Noi   Precautions   Precautions None   Restrictions   Weight Bearing Restrictions No   Balance Screen   Has the patient fallen in the past 6 months Yes   How many times? 1   Has the patient had a decrease in activity level because of a fear of falling?  Yes   Is the patient reluctant to leave their home because of a fear of falling?  No   Home Environment   Living Environment Private residence   Living Arrangements Spouse/significant other   Home Access Ramped entrance   Prior Function   Level of Ramos device for independence   Cognition   Overall Cognitive Status Within Functional Limits for tasks assessed   ROM / Strength   AROM / PROM / Strength AROM;PROM;Strength   AROM   AROM Assessment Site Knee   Right/Left Knee Right;Left   Right Knee Extension 0  Right Knee Flexion 130   Left Knee Extension 22   Left Knee Flexion 126   PROM   PROM Assessment Site Knee   Right/Left Knee Left   Left Knee Extension -3   Left Knee Flexion 130   Strength   Overall Strength Comments L& hip 4-4+/5 with some pain..    Strength Assessment Site Knee   Right/Left Knee Right;Left   Right Knee Flexion 5/5   Right Knee Extension 5/5   Left Knee Flexion 5/5   Left Knee Extension 4/5  with pain   Palpation   Patella mobility mild decr mobility   Palpation comment nterior knee Lt and LT quad and lateral thigh    Ambulation/Gait   Gait Comments walks WBAT with RW                           PT Education - 08/14/15 1226    Education provided Yes   Education Details POC,  HIP STRENGTH, Medicaid limits   Person(s)  Educated Patient   Methods Explanation;Verbal cues;Handout   Comprehension Returned demonstration;Verbalized understanding             PT Long Term Goals - 08/14/15 1251    PT LONG TERM GOAL #1   Title She will be independent in all HEP issued   Baseline She needs progression of HEP she has devised for herself   Time 8   Period Weeks   Status New   PT LONG TERM GOAL #2   Title Her pain will ease by 50% with activity on feet at home   Baseline pain can be severe with activity in home   Time 8   Period Weeks   Status New   PT LONG TERM GOAL #3   Title She will return to normal home tasks with only unilateral device only if needed   Baseline getting asssit for home tasks    Time 8   Period Weeks   Status New   PT LONG TERM GOAL #4   Title She will have full active Lt knee motion   Baseline 20 degree quad lag   Time 8   Period Weeks   Status New   PT LONG TERM GOAL #5   Title She will walk with least restrictive device in and out of home   Baseline using RW   Time 8   Period Weeks   Status New   Additional Long Term Goals   Additional Long Term Goals Yes   PT LONG TERM GOAL #6   Title She is able to walk up and down stairs with one rail step over step safely   Time 8   Period Weeks   Status New               Plan - 08/14/15 1245    Clinical Impression Statement Ms Elem presents post IM nailing LT femur ((CPT code [phone]) with pain /swelling/weakness  causing  activity tolerance and need for assitance with home tasks and is not able to return to work at this time and now referred for PT . She was NWB for 12 weeks post surgery when her WB restrictions were lifted per pt. report.  She has been working at home on strength and ROM.  She reports she has recieved Medicaid and was wanting to come to PT to ease pain and improve strength and walking off device. She report knee buckling of LT knee  and pain limiting tolerance to home task, RTW, independent activity.     Rehab Potential Good   PT Frequency 2x / week   PT Duration 8 weeks   PT Treatment/Interventions Cryotherapy;Dentist;Therapeutic exercise;Manual techniques;Taping;Patient/family education;Passive range of motion;Vasopneumatic Device   PT Next Visit Plan If approved will use modalities and tape for pain , Manual for pain and pregess after review of HEP   PT Home Exercise Plan 4way SLR   Consulted and Agree with Plan of Care Patient      Patient will benefit from skilled therapeutic intervention in order to improve the following deficits and impairments:  Decreased strength, Pain, Decreased activity tolerance, Increased muscle spasms, Difficulty walking  Visit Diagnosis: Pain in left knee - Plan: PT plan of care cert/re-cert  Muscle weakness (generalized) - Plan: PT plan of care cert/re-cert  Difficulty in walking, not elsewhere classified - Plan: PT plan of care cert/re-cert     Problem List Patient Active Problem List   Diagnosis Date Noted  . Chest pain with moderate risk of acute coronary syndrome 07/24/2015  . Dyslipidemia 07/24/2015  . Strong family history of coronary artery disease 07/24/2015  . History of breast cancer 07/24/2015  . Vitamin D deficiency 02/15/2015  . Fall from truck  02/15/2015  . Diabetes mellitus type 2 in obese ([place]) 02/14/2015  . Fracture, femur, supracondylar ([place]), left 02/12/2015  . Chest pain on exertion 01/01/2015  . Anxiety 01/01/2015  . Acute hyperglycemia 01/01/2015    Darrel Hoover  PT 08/14/2015, 1:04 PM  [facility] [address] [place], [place], [address] Phone: [phone]   Fax:  [phone]  Name: KAMY POINSETT MRN: [id number] Date of Birth: 1956-04-09   PHYSICAL THERAPY DISCHARGE SUMMARY  Visits from Start of Care: eval  Current functional level related to goals / functional outcomes: Unknown as visits not  approved due to time frame from surgery   Remaining deficits: Unknown   Education / Equipment: Unknown Plan: Patient agrees to discharge.  Patient goals were not met. Patient is being discharged due to financial reasons.  ?????   Darrel Hoover, PT  12/02/15  2:56 PM

## 2015-08-14 NOTE — Patient Instructions (Signed)
Hip Extension (Prone)   Lift left leg ____ inches from floor, keeping knee locked. Repeat ____ times per set. Do ____ sets per session. Do ____ sessions per day.  http://orth.exer.us/98   Copyright  VHI. All rights reserved.  Hip Adduction: Leg Lift (Eccentric) - Side-Lying   Lie on side with top leg bent, foot flat behind lower leg. Quickly lift lower leg. Slowly lower for 3-5 seconds. ___ reps per set, ___ sets per day, ___ days per week. Add ___ lbs when you achieve ___ repetitions.  Copyright  VHI. All rights reserved.  Abduction: Side Leg Lift (Eccentric) - Side-Lying   Lie on side. Lift top leg slightly higher than shoulder level. Keep top leg straight with body, toes pointing forward. Slowly lower for 3-5 seconds. ___ reps per set, ___ sets per day, ___ days per week. Add ___ lbs when you achieve ___ repetitions.  Copyright  VHI. All rights reserved.  Strengthening: Straight Leg Raise (Phase 1)   Tighten muscles on front of right thigh, then lift leg ____ inches from surface, keeping knee locked.  Repeat ____ times per set. Do ____ sets per session. Do ____ sessions per day.  http://orth.exer.us/614   Copyright  VHI. All rights reserved. DO ALL ABOVE 1-2X/DAY 10-25 REPS AS ABLE .quads

## 2015-08-27 ENCOUNTER — Ambulatory Visit: Payer: Medicaid Other

## 2015-10-03 ENCOUNTER — Ambulatory Visit
Admission: RE | Admit: 2015-10-03 | Discharge: 2015-10-03 | Disposition: A | Discharge status: Home / Self Care | Payer: Medicaid Other | Source: Ambulatory Visit

## 2015-10-03 DIAGNOSIS — Z1231 Encounter for screening mammogram for malignant neoplasm of breast: Secondary | ICD-10-CM

## 2016-01-11 IMAGING — RF DG C-ARM 1-60 MIN-NO REPORT
1 series · 9 of 9 positions shown · non-contrast
Comparison: 02/11/2015

CLINICAL DATA: 59-year-old female with intraoperative fluoroscopic
images fourth femur repair

EXAM:
LEFT FEMUR 2 VIEWS; DG C-ARM 1-60 MIN-NO REPORT

[Series 1: run · 9 of 9 slices shown]
[im 1/9]
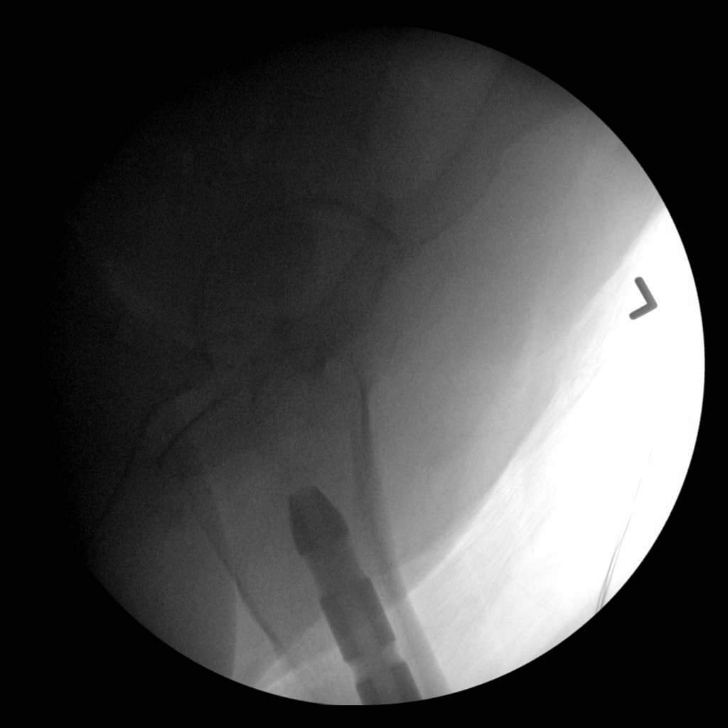
[im 2/9]
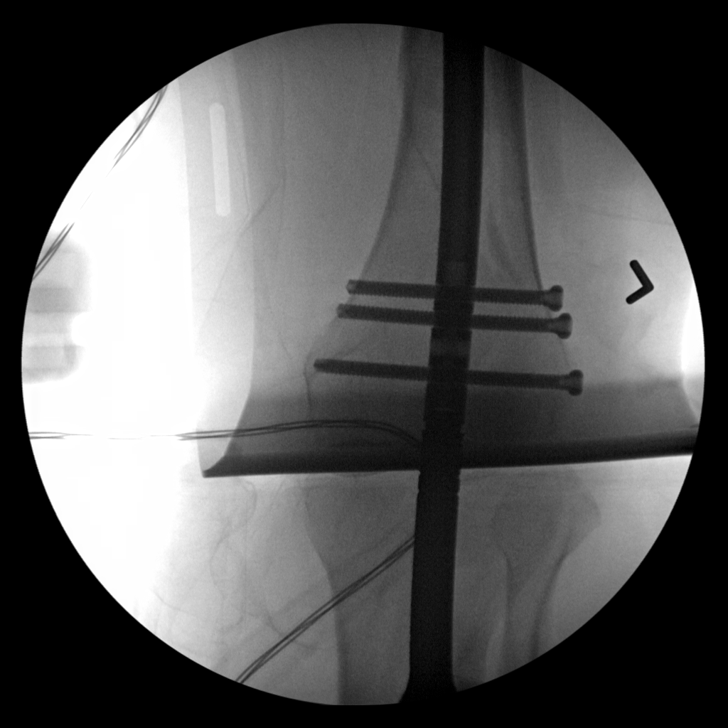
[im 3/9]
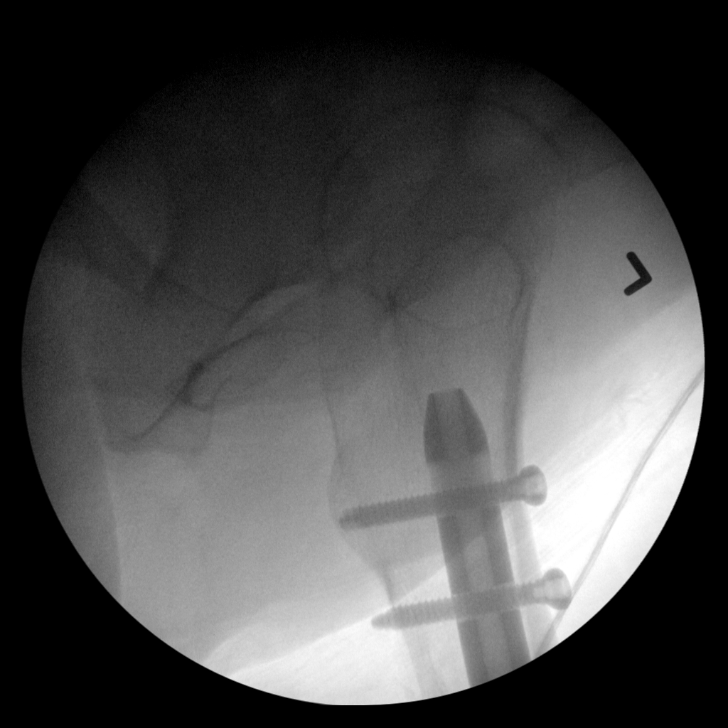
[im 4/9]
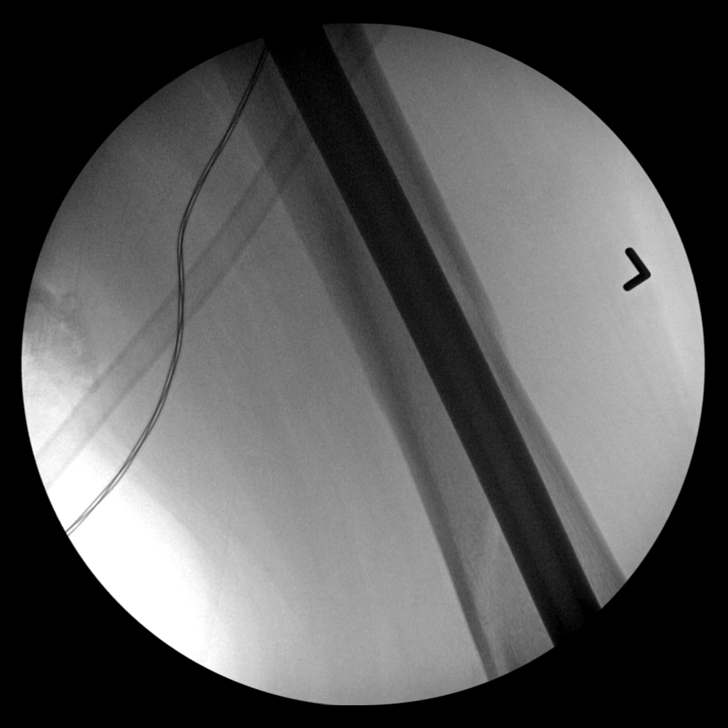
[im 5/9]
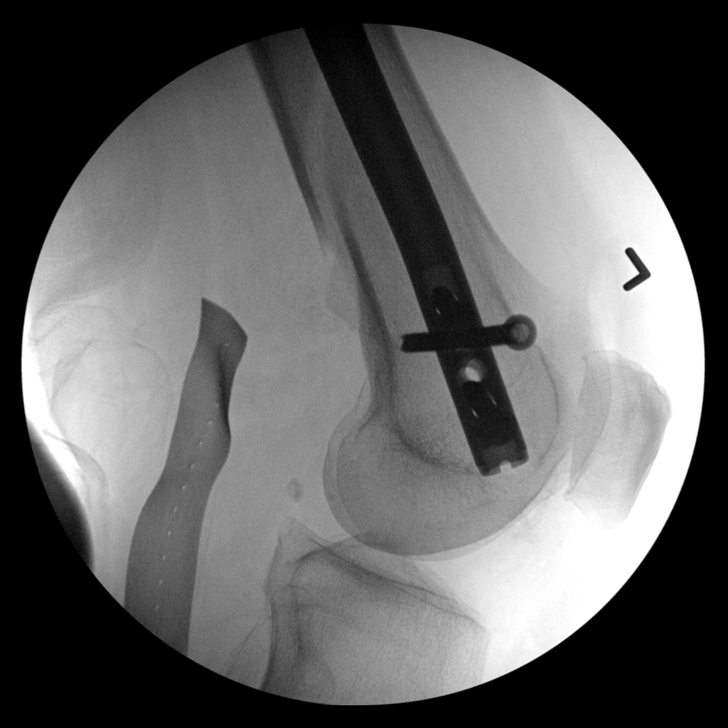
[im 6/9]
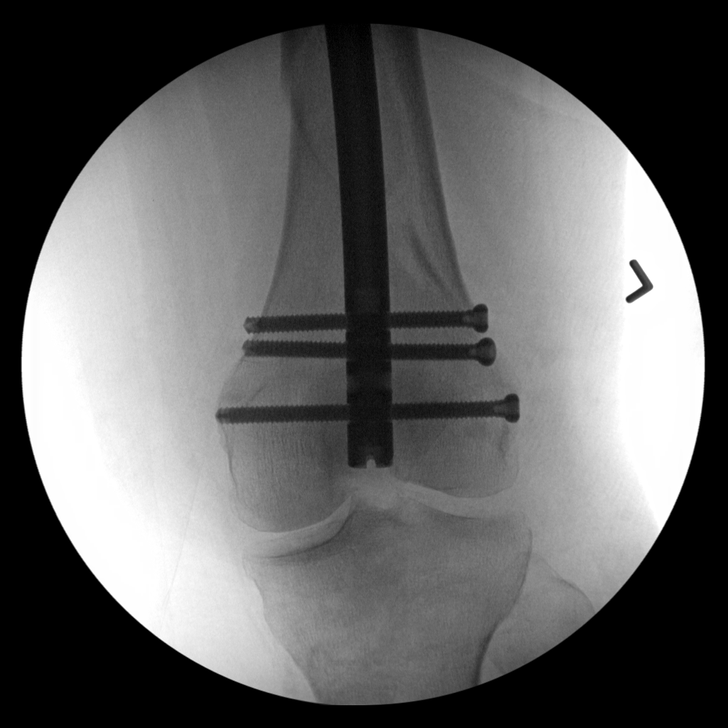
[im 7/9]
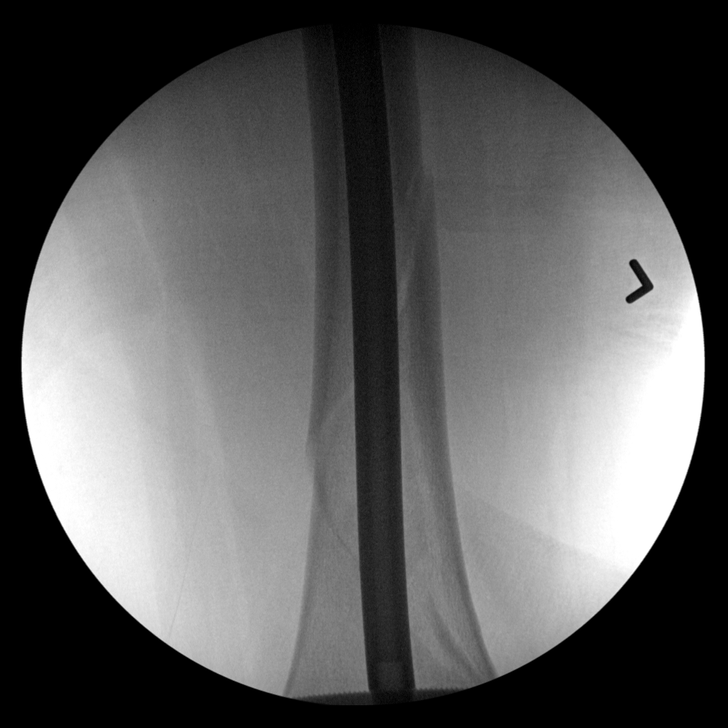
[im 8/9]
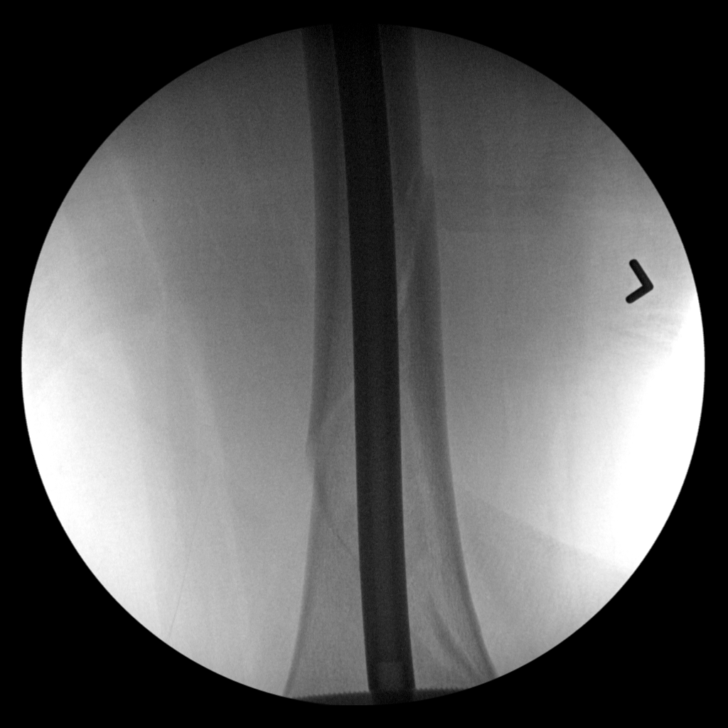
[im 9/9]
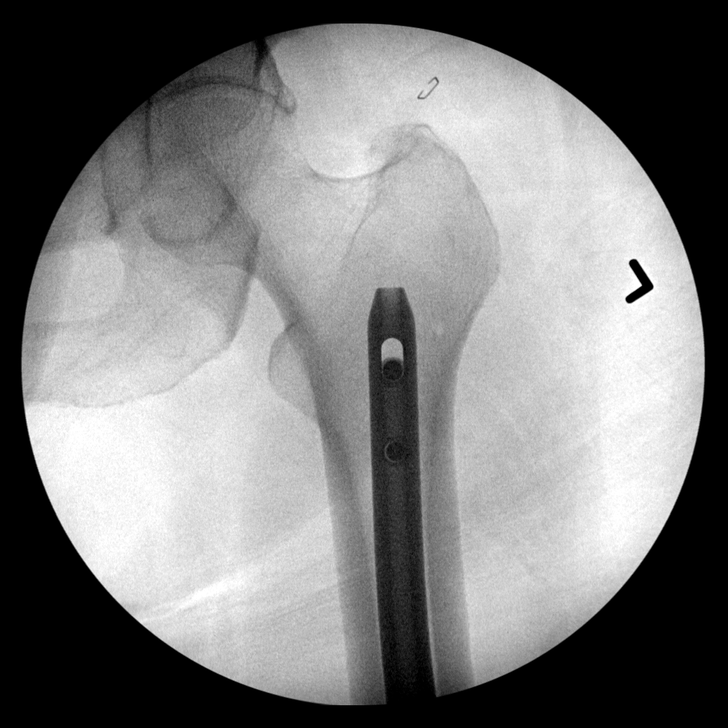

[9 of 9 positions shown; findings below may reference images not displayed]

FINDINGS: Multiple limited intraoperative fluoroscopic spot images during open
reduction internal fixation of left femur fracture.

Images demonstrate retrograde intra medullary rod placement of the
left femur spanning a distal diaphyseal comminuted fracture. Two
proximal interlocking screws, with 3 distal interlocking screws.
Alignment at the fracture site improved.

No immediate complicating feature.
IMPRESSION: Intraoperative fluoroscopic spot images of open reduction internal
fixation of left femur fracture with no immediate complicating
features, as above.

Please refer to the dictated operative report for full details of
intraoperative findings and procedure.

## 2016-01-11 IMAGING — CR DG FEMUR 2+V PORT*L*
4 series · 4 of 4 positions shown · non-contrast
Comparison: 02/11/2015, fluoroscopic study on today's date.

CLINICAL DATA: 59-year-old female with a history of femur fracture
repair

EXAM:
LEFT FEMUR PORTABLE 2 VIEWS

[AP (1 of 2)]
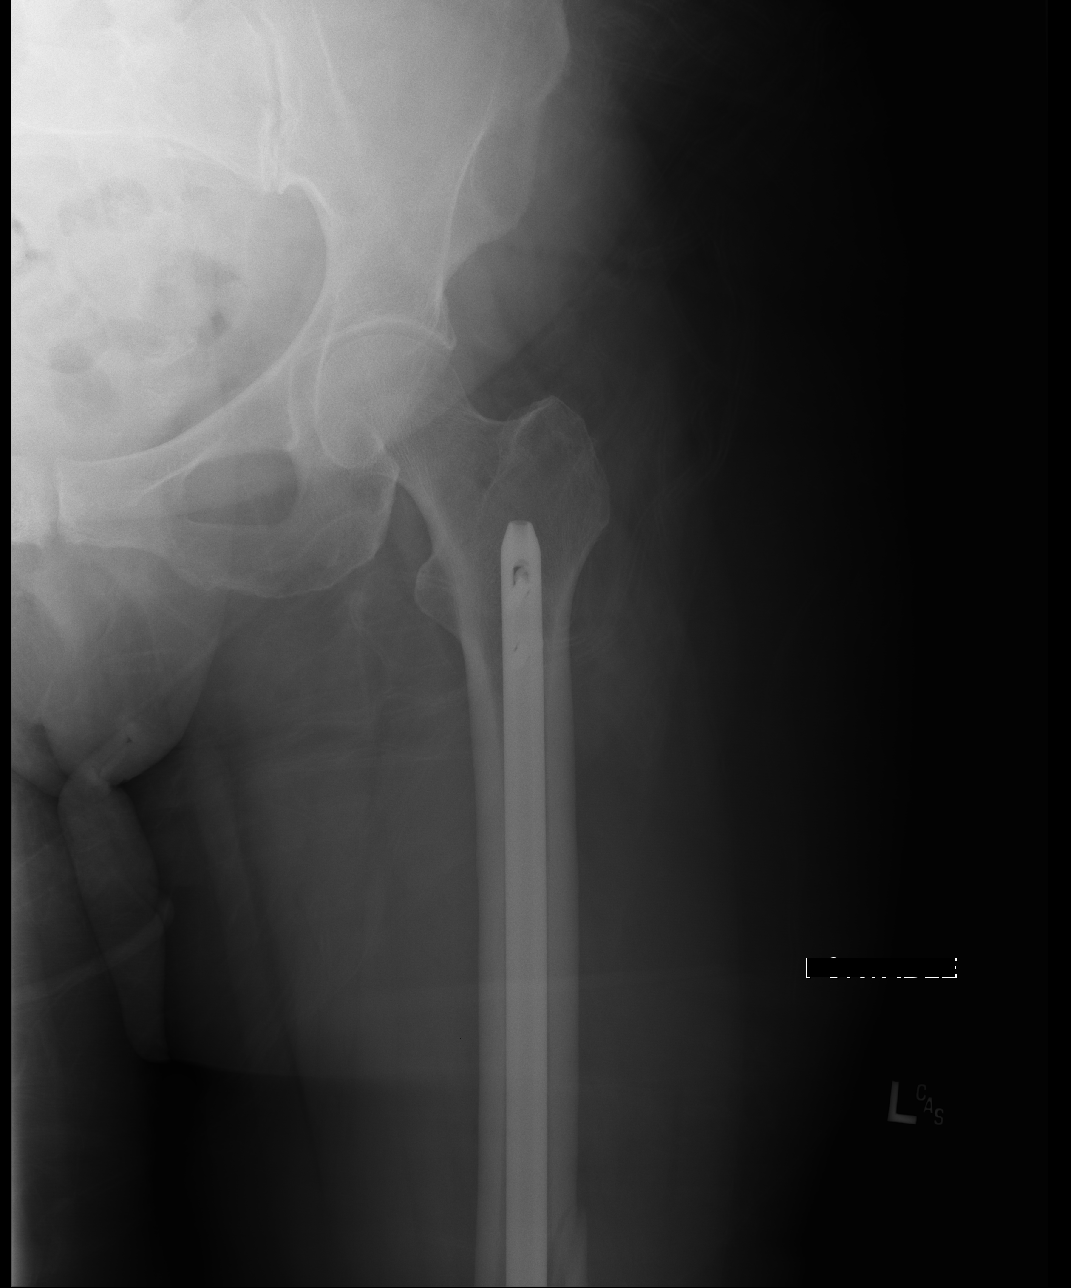

[AP (2 of 2)]
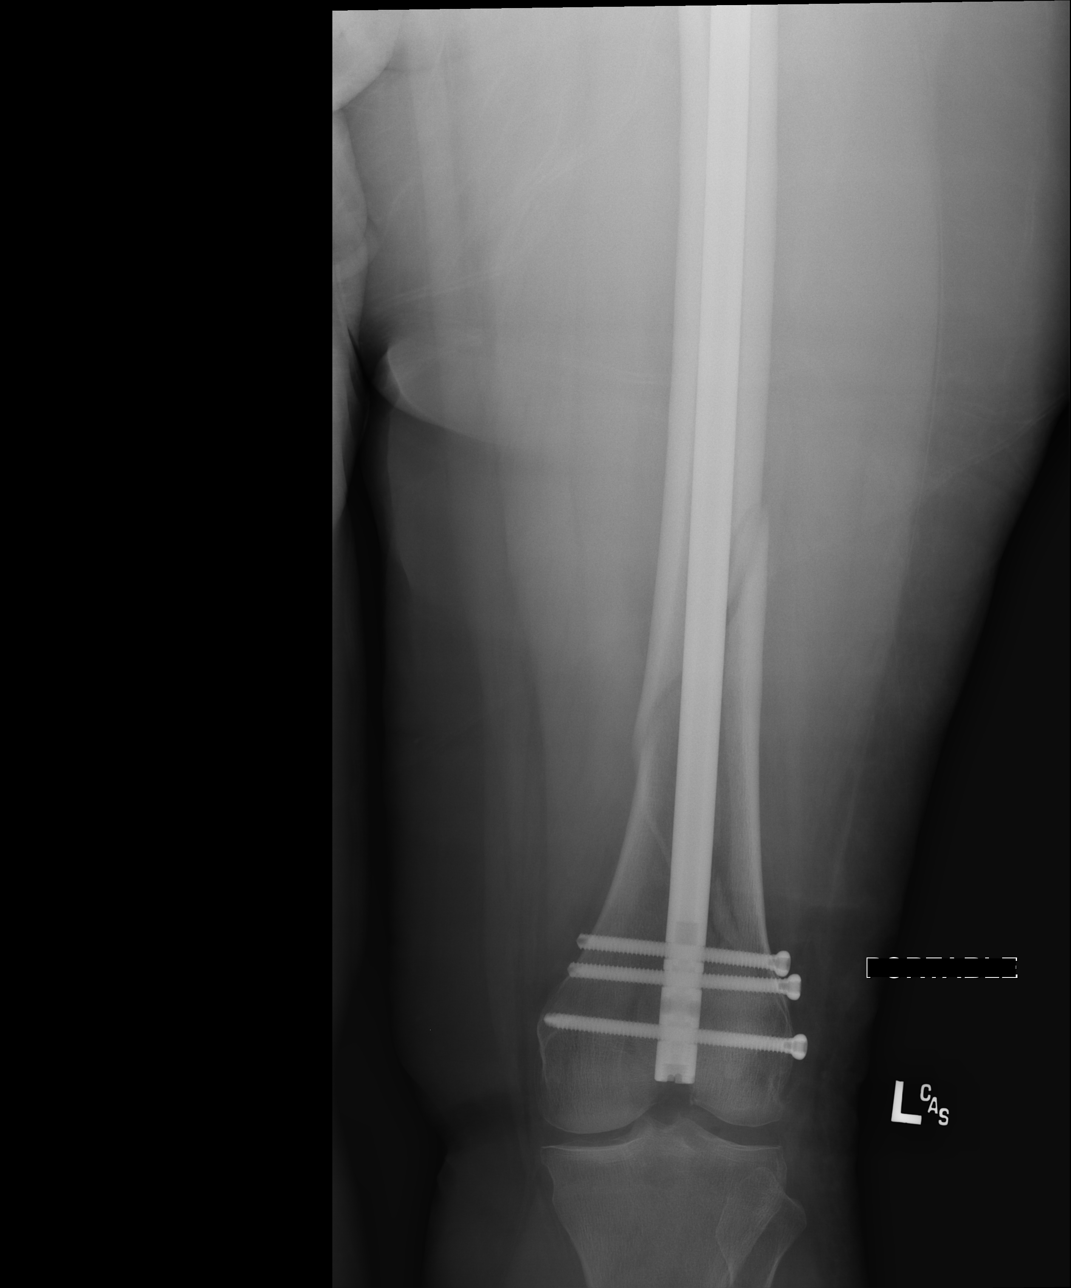

[xtable lateral (1 of 2)]
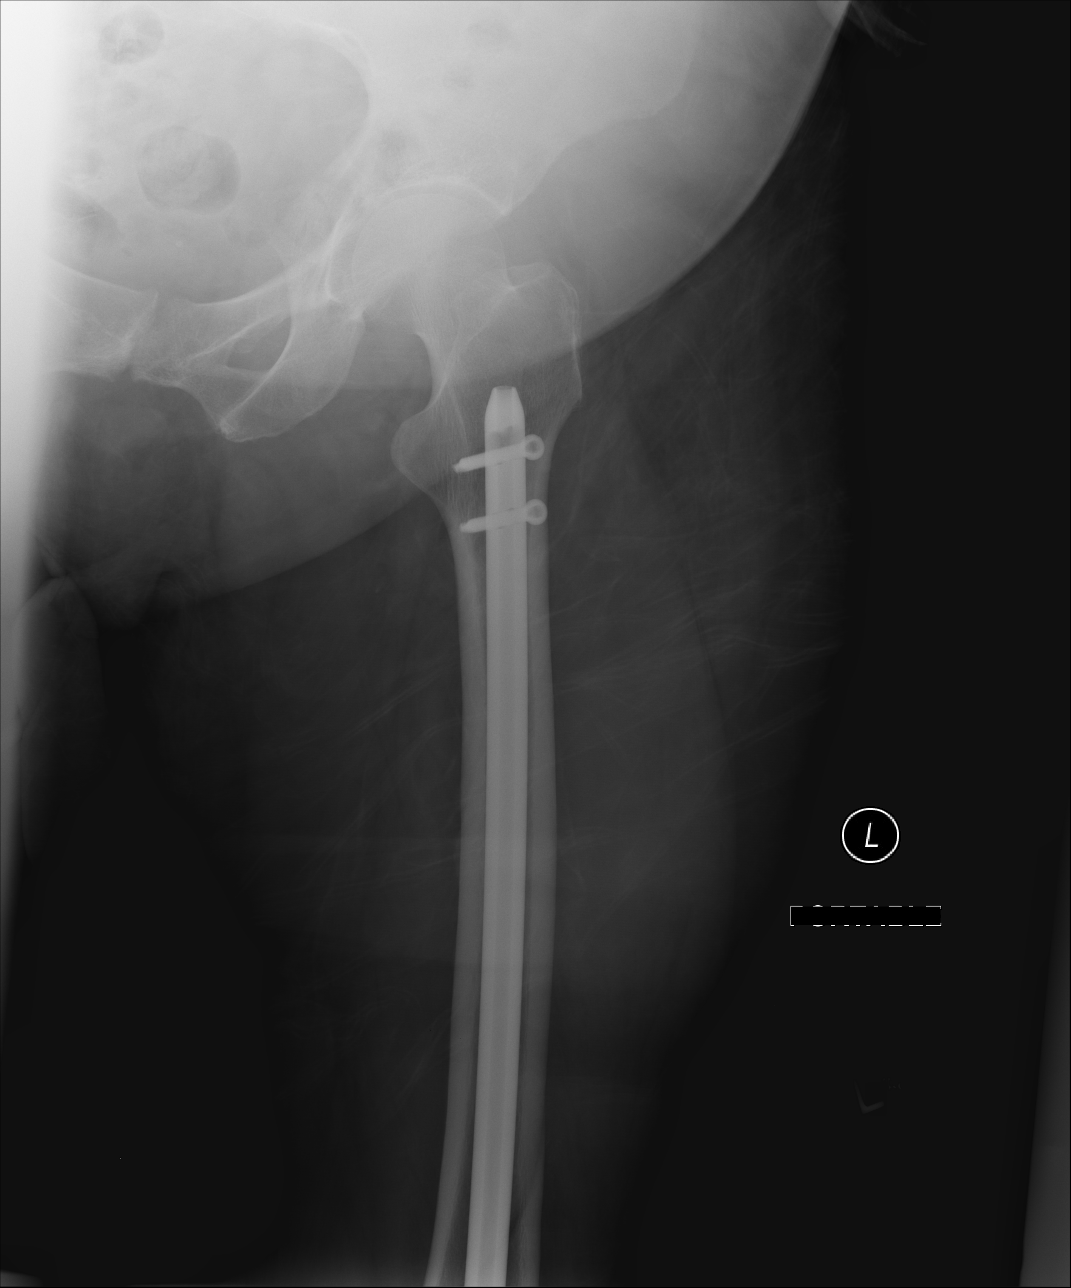

[xtable lateral (2 of 2)]
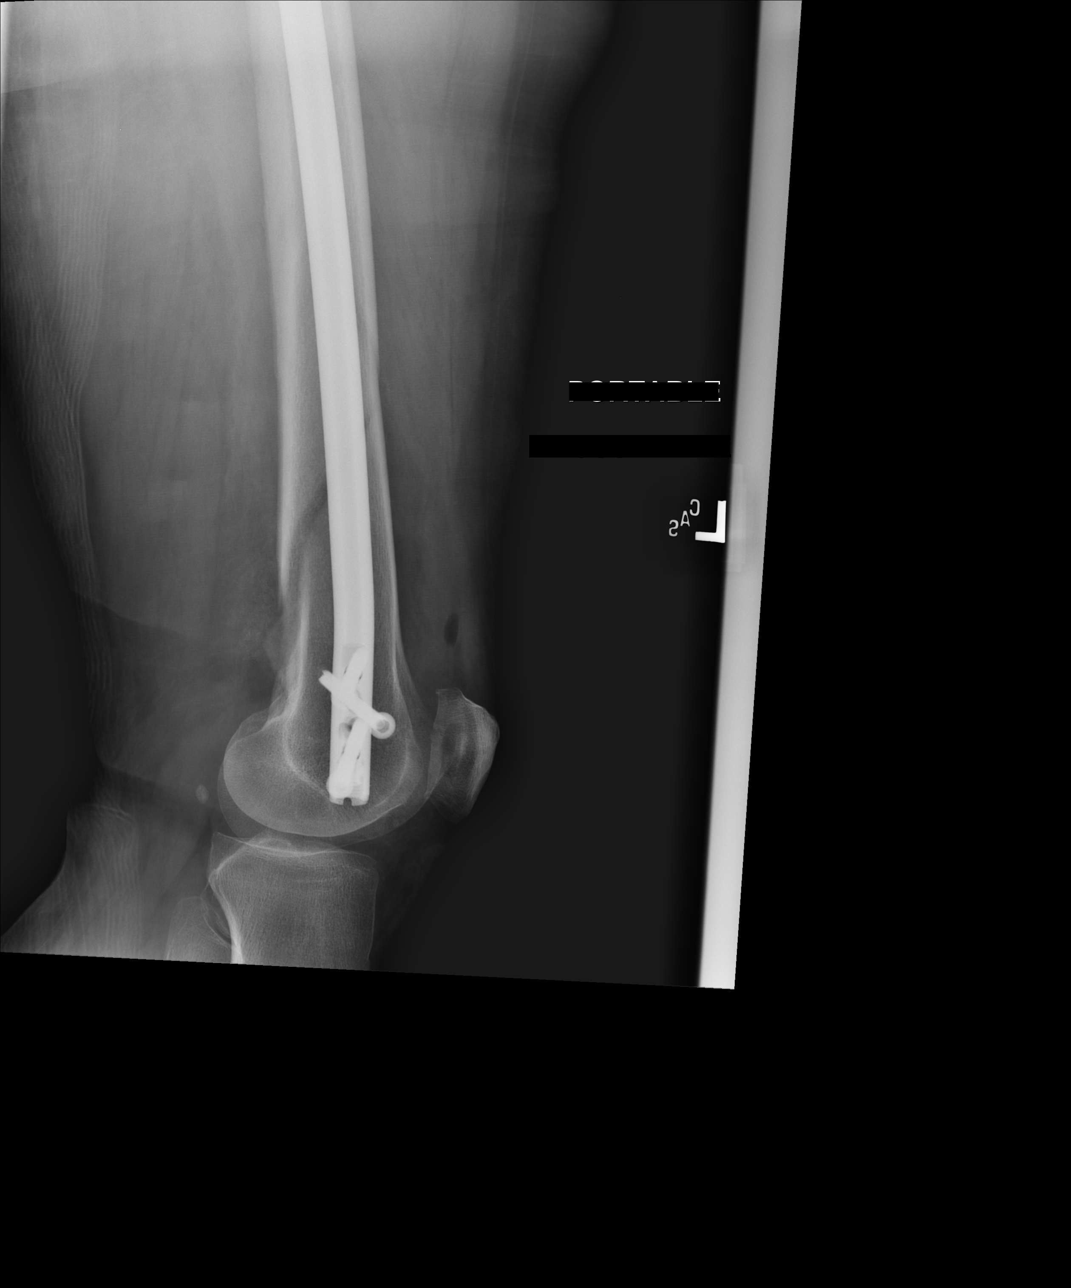

[4 of 4 positions shown; findings below may reference images not displayed]

FINDINGS: Interval open reduction internal fixation of previously identified
distal femur fracture with retrograde intra medullary rod. Two
proximal and 3 distal interlocking screws. Improved alignment at the
fracture site. No complicating features identified.

Expected gas within the joint effusion at the knee, in this early
postop.
IMPRESSION: Interval ORIF of left femur fracture with retrograde intra medullary
rod placement, as above, with improved alignment at the fracture
site. No complicating features.

## 2016-01-13 ENCOUNTER — Encounter (HOSPITAL_COMMUNITY)
Admission: RE | Admit: 2016-01-13 | Discharge: 2016-01-13 | Disposition: A | Discharge status: Home / Self Care | Payer: Medicaid Other | Source: Ambulatory Visit | Attending: Orthopedic Surgery | Admitting: Orthopedic Surgery

## 2016-01-13 ENCOUNTER — Encounter (HOSPITAL_COMMUNITY): Payer: Self-pay

## 2016-01-13 DIAGNOSIS — Z01812 Encounter for preprocedural laboratory examination: Secondary | ICD-10-CM | POA: Diagnosis not present

## 2016-01-13 HISTORY — DX: Prediabetes: R73.03

## 2016-01-13 HISTORY — DX: Essential (primary) hypertension: I10

## 2016-01-13 LAB — BASIC METABOLIC PANEL
ANION GAP: 7 (ref 5–15)
BUN: 10 mg/dL (ref 6–20)
CALCIUM: 9.2 mg/dL (ref 8.9–10.3)
CHLORIDE: 105 mmol/L (ref 101–111)
CO2: 27 mmol/L (ref 22–32)
CREATININE: 0.85 mg/dL (ref 0.44–1.00)
GFR calc non Af Amer: >60 mL/min (ref >60)
Glucose, Bld: 103 mg/dL — ABNORMAL HIGH (ref 65–99)
Potassium: 3.9 mmol/L (ref 3.5–5.1)
Sodium: 139 mmol/L (ref 135–145)

## 2016-01-13 LAB — CBC
HCT: 42.3 % (ref 36.0–46.0)
HEMOGLOBIN: 13.7 g/dL (ref 12.0–15.0)
MCH: 29 pg (ref 26.0–34.0)
MCHC: 32.4 g/dL (ref 30.0–36.0)
MCV: 89.6 fL (ref 78.0–100.0)
PLATELETS: 235 10*3/uL (ref 150–400)
RBC: 4.72 MIL/uL (ref 3.87–5.11)
RDW: 12.7 % (ref 11.5–15.5)
WBC: 7.9 10*3/uL (ref 4.0–10.5)

## 2016-01-13 NOTE — Progress Notes (Signed)
Office called for orders. Message left on machine.

## 2016-01-13 NOTE — Pre-Procedure Instructions (Addendum)
Isabella [name]  01/13/2016      [place], [place] [id number] [place] [address] Phone: [phone] Fax: [phone]    Your procedure is scheduled on 01/20/16.  Report to [facility] Admitting at 945 A.M.  Call this number if you have problems the morning of surgery:  [phone]   Remember:  Do not eat food or drink liquids after midnight.  Take these medicines the morning of surgery with A SIP OF WATER metoprolol, nitro if needed,pyridium if needed  STOP all herbel meds, nsaids (aleve,naproxen,advil,ibuprofen) 7 days prior to surgery starting 01/13/16(TODAY) including aspirin,oscal, vit D, fish oil,vit A, vit E, vit C   Do not wear jewelry, make-up or nail polish.  Do not wear lotions, powders, or perfumes, or deoderant.  Do not shave 48 hours prior to surgery.  Men may shave face and neck.  Do not bring valuables to the hospital.  [facility] is not responsible for any belongings or valuables.  Contacts, dentures or bridgework may not be worn into surgery.  Leave your suitcase in the car.  After surgery it may be brought to your room.  For patients admitted to the hospital, discharge time will be determined by your treatment team.  Patients discharged the day of surgery will not be allowed to drive home.   Name and phone number of your driver:    Special instructions:  Special Instructions: [facility] - Preparing for Surgery  Before surgery, you can play an important role.  Because skin is not sterile, your skin needs to be as free of germs as possible.  You can reduce the number of germs on you skin by washing with CHG (chlorahexidine gluconate) soap before surgery.  CHG is an antiseptic cleaner which kills germs and bonds with the skin to continue killing germs even after washing.  Please DO NOT use if you have an allergy to CHG or antibacterial soaps.  If your skin becomes reddened/irritated  stop using the CHG and inform your nurse when you arrive at Short Stay.  Do not shave (including legs and underarms) for at least 48 hours prior to the first CHG shower.  You may shave your face.  Please follow these instructions carefully:   1.  Shower with CHG Soap the night before surgery and the morning of Surgery.  2.  If you choose to wash your hair, wash your hair first as usual with your normal shampoo.  3.  After you shampoo, rinse your hair and body thoroughly to remove the Shampoo.  4.  Use CHG as you would any other liquid soap.  You can apply chg directly  to the skin and wash gently with scrungie or a clean washcloth.  5.  Apply the CHG Soap to your body ONLY FROM THE NECK DOWN.  Do not use on open wounds or open sores.  Avoid contact with your eyes ears, mouth and genitals (private parts).  Wash genitals (private parts)       with your normal soap.  6.  Wash thoroughly, paying special attention to the area where your surgery will be performed.  7.  Thoroughly rinse your body with warm water from the neck down.  8.  DO NOT shower/wash with your normal soap after using and rinsing off the CHG Soap.  9.  Pat yourself dry with a clean towel.            10.  Wear clean pajamas.            11.  Place clean sheets on your bed the night of your first shower and do not sleep with pets.  Day of Surgery  Do not apply any lotions/deodorants the morning of surgery.  Please wear clean clothes to the hospital/surgery center.  Please read over the  fact sheets that you were given.

## 2016-01-14 LAB — HEMOGLOBIN A1C
HEMOGLOBIN A1C: 6.3 % — AB (ref 4.8–5.6)
Mean Plasma Glucose: 134 mg/dL

## 2016-01-14 NOTE — Progress Notes (Signed)
Anesthesia Chart Review:  Pt is a 60 year old female scheduled for hardware removal L femur, L knee arthroscopy on 01/20/2016 with Altamese [place], MD  - Cardiologist is Kirk Ruths, MD.   PMH includes:  HTN, breast cancer. Never smoker. BMI 40. S/p IM nail L femur 02/12/15.   Medications include: ASA, metoprolol  Preoperative labs reviewed.  HgbA1c 6.3, glucose 103  CXR 07/17/15: No active cardiopulmonary disease  EKG 07/29/15: Sinus bradycardia (58 bpm)  Cardiac cath 07/29/15:   Moderate disease in the proximal Circumflex prior to bifurcating into OM 1 and AV groove circumflex.  Ost 1st Diag to 1st Diag lesion, 50% stenosed.  The left ventricular systolic function is normal. Angiographically moderate minimal CAD with no potential culprit lesion to explain patient's chest pain. Most likely nonanginal chest pain. Cannot exclude coronary spasm.  Echo 01/01/15:  - Left ventricle: Global Longitudinal LV strain is normal at -21% The cavity size was normal. Systolic function was normal. The estimated ejection fraction was in the range of 60% to 65%. Wall motion was normal; there were no regional wall motion abnormalities. - Aortic valve: Trileaflet; normal thickness, mildly calcified leaflets. - Mitral valve: There was trivial regurgitation.  If no changes, I anticipate pt can proceed with surgery as scheduled.   Willeen [place], FNP-BC [facility]/Anesthesiology Phone: [phone] 01/14/2016 2:39 PM

## 2016-01-19 MED ORDER — CEFAZOLIN SODIUM-DEXTROSE 2-4 GM/100ML-% IV SOLN
2.0000 g | INTRAVENOUS | Status: AC
  Administered 2016-01-20: 2 g via INTRAVENOUS
  Filled 2016-01-19: qty 100

## 2016-01-20 ENCOUNTER — Encounter (HOSPITAL_COMMUNITY)
Admission: RE | Disposition: A | Discharge status: Home / Self Care | Payer: Self-pay | Source: Ambulatory Visit | Attending: Orthopedic Surgery

## 2016-01-20 ENCOUNTER — Encounter (HOSPITAL_COMMUNITY): Payer: Self-pay | Admitting: *Deleted

## 2016-01-20 ENCOUNTER — Ambulatory Visit (HOSPITAL_COMMUNITY): Payer: Medicaid Other | Admitting: Emergency Medicine

## 2016-01-20 ENCOUNTER — Ambulatory Visit (HOSPITAL_COMMUNITY)
Admission: RE | Admit: 2016-01-20 | Discharge: 2016-01-20 | Disposition: A | Discharge status: Home / Self Care | Payer: Medicaid Other | Source: Ambulatory Visit | Attending: Orthopedic Surgery | Admitting: Orthopedic Surgery

## 2016-01-20 ENCOUNTER — Ambulatory Visit (HOSPITAL_COMMUNITY): Payer: Medicaid Other

## 2016-01-20 ENCOUNTER — Ambulatory Visit (HOSPITAL_COMMUNITY): Payer: Medicaid Other | Admitting: Certified Registered Nurse Anesthetist

## 2016-01-20 DIAGNOSIS — Z853 Personal history of malignant neoplasm of breast: Secondary | ICD-10-CM | POA: Insufficient documentation

## 2016-01-20 DIAGNOSIS — M23242 Derangement of anterior horn of lateral meniscus due to old tear or injury, left knee: Secondary | ICD-10-CM | POA: Diagnosis not present

## 2016-01-20 DIAGNOSIS — Z7982 Long term (current) use of aspirin: Secondary | ICD-10-CM | POA: Insufficient documentation

## 2016-01-20 DIAGNOSIS — T84195A Other mechanical complication of internal fixation device of left femur, initial encounter: Secondary | ICD-10-CM | POA: Insufficient documentation

## 2016-01-20 DIAGNOSIS — I10 Essential (primary) hypertension: Secondary | ICD-10-CM | POA: Diagnosis not present

## 2016-01-20 DIAGNOSIS — M1712 Unilateral primary osteoarthritis, left knee: Secondary | ICD-10-CM | POA: Insufficient documentation

## 2016-01-20 DIAGNOSIS — M65862 Other synovitis and tenosynovitis, left lower leg: Secondary | ICD-10-CM | POA: Diagnosis not present

## 2016-01-20 DIAGNOSIS — Y793 Surgical instruments, materials and orthopedic devices (including sutures) associated with adverse incidents: Secondary | ICD-10-CM | POA: Insufficient documentation

## 2016-01-20 DIAGNOSIS — M2342 Loose body in knee, left knee: Secondary | ICD-10-CM | POA: Diagnosis not present

## 2016-01-20 DIAGNOSIS — Z419 Encounter for procedure for purposes other than remedying health state, unspecified: Secondary | ICD-10-CM

## 2016-01-20 DIAGNOSIS — R7303 Prediabetes: Secondary | ICD-10-CM | POA: Diagnosis not present

## 2016-01-20 DIAGNOSIS — E559 Vitamin D deficiency, unspecified: Secondary | ICD-10-CM | POA: Diagnosis not present

## 2016-01-20 DIAGNOSIS — Z923 Personal history of irradiation: Secondary | ICD-10-CM | POA: Insufficient documentation

## 2016-01-20 DIAGNOSIS — Z9221 Personal history of antineoplastic chemotherapy: Secondary | ICD-10-CM | POA: Insufficient documentation

## 2016-01-20 HISTORY — PX: HARDWARE REMOVAL: SHX979

## 2016-01-20 HISTORY — PX: KNEE ARTHROSCOPY: SHX127

## 2016-01-20 LAB — GLUCOSE, CAPILLARY
GLUCOSE-CAPILLARY: 119 mg/dL — AB (ref 65–99)
GLUCOSE-CAPILLARY: 124 mg/dL — AB (ref 65–99)

## 2016-01-20 SURGERY — REMOVAL, HARDWARE
Anesthesia: General | Site: Knee | Laterality: Left

## 2016-01-20 MED ORDER — SUGAMMADEX SODIUM 500 MG/5ML IV SOLN
INTRAVENOUS | Status: DC | PRN
  Administered 2016-01-20: 300 mg via INTRAVENOUS

## 2016-01-20 MED ORDER — LIDOCAINE HCL (CARDIAC) 20 MG/ML IV SOLN
INTRAVENOUS | Status: DC | PRN
  Administered 2016-01-20: 60 mg via INTRAVENOUS

## 2016-01-20 MED ORDER — FENTANYL CITRATE (PF) 100 MCG/2ML IJ SOLN
INTRAMUSCULAR | Status: DC | PRN
  Administered 2016-01-20 (×2): 50 ug via INTRAVENOUS
  Administered 2016-01-20: 100 ug via INTRAVENOUS
  Administered 2016-01-20 (×2): 50 ug via INTRAVENOUS

## 2016-01-20 MED ORDER — SODIUM CHLORIDE 0.9 % IR SOLN
Status: DC | PRN
Start: 2016-01-20 — End: 2016-01-20
  Administered 2016-01-20: 1000 mL
  Administered 2016-01-20 (×2): 3000 mL

## 2016-01-20 MED ORDER — OXYCODONE HCL 5 MG/5ML PO SOLN: 5.0000 mg | Freq: Once | ORAL | Status: AC | PRN

## 2016-01-20 MED ORDER — CHLORHEXIDINE GLUCONATE 4 % EX LIQD: 60.0000 mL | Freq: Once | CUTANEOUS | Status: DC

## 2016-01-20 MED ORDER — PHENYLEPHRINE HCL 10 MG/ML IJ SOLN
INTRAMUSCULAR | Status: DC | PRN
  Administered 2016-01-20: 10 ug/min via INTRAVENOUS

## 2016-01-20 MED ORDER — ONDANSETRON HCL 4 MG/2ML IJ SOLN
INTRAMUSCULAR | Status: AC
  Administered 2016-01-20: 4 mg via INTRAVENOUS
  Filled 2016-01-20: qty 2

## 2016-01-20 MED ORDER — FENTANYL CITRATE (PF) 100 MCG/2ML IJ SOLN
INTRAMUSCULAR | Status: AC
  Administered 2016-01-20: 50 ug via INTRAVENOUS
  Filled 2016-01-20: qty 2

## 2016-01-20 MED ORDER — LACTATED RINGERS IV SOLN
INTRAVENOUS | Status: DC
  Administered 2016-01-20 (×3): via INTRAVENOUS

## 2016-01-20 MED ORDER — FENTANYL CITRATE (PF) 100 MCG/2ML IJ SOLN
INTRAMUSCULAR | Status: AC
  Filled 2016-01-20: qty 2

## 2016-01-20 MED ORDER — ONDANSETRON HCL 4 MG/2ML IJ SOLN
INTRAMUSCULAR | Status: DC | PRN
  Administered 2016-01-20: 4 mg via INTRAVENOUS

## 2016-01-20 MED ORDER — PROPOFOL 10 MG/ML IV BOLUS
INTRAVENOUS | Status: DC | PRN
  Administered 2016-01-20: 160 mg via INTRAVENOUS

## 2016-01-20 MED ORDER — KETOROLAC TROMETHAMINE 10 MG PO TABS: 10.0000 mg | ORAL_TABLET | Freq: Four times a day (QID) | ORAL | 0 refills | Status: DC | PRN

## 2016-01-20 MED ORDER — FENTANYL CITRATE (PF) 100 MCG/2ML IJ SOLN
25.0000 ug | INTRAMUSCULAR | Status: DC | PRN
  Administered 2016-01-20 (×2): 50 ug via INTRAVENOUS

## 2016-01-20 MED ORDER — OXYCODONE HCL 5 MG PO TABS
ORAL_TABLET | ORAL | Status: AC
  Administered 2016-01-20: 5 mg via ORAL
  Filled 2016-01-20: qty 1

## 2016-01-20 MED ORDER — OXYCODONE HCL 5 MG PO TABS
5.0000 mg | ORAL_TABLET | Freq: Once | ORAL | Status: AC | PRN
  Administered 2016-01-20: 5 mg via ORAL

## 2016-01-20 MED ORDER — ONDANSETRON HCL 4 MG/2ML IJ SOLN
4.0000 mg | Freq: Once | INTRAMUSCULAR | Status: AC | PRN
  Administered 2016-01-20: 4 mg via INTRAVENOUS

## 2016-01-20 MED ORDER — OXYCODONE-ACETAMINOPHEN 5-325 MG PO TABS: 1.0000 | ORAL_TABLET | Freq: Three times a day (TID) | ORAL | 0 refills | Status: AC | PRN

## 2016-01-20 MED ORDER — PROMETHAZINE HCL 12.5 MG PO TABS: 12.5000 mg | ORAL_TABLET | Freq: Four times a day (QID) | ORAL | 0 refills | Status: AC | PRN

## 2016-01-20 MED ORDER — MIDAZOLAM HCL 2 MG/2ML IJ SOLN
INTRAMUSCULAR | Status: AC
  Filled 2016-01-20: qty 2

## 2016-01-20 MED ORDER — MIDAZOLAM HCL 5 MG/5ML IJ SOLN
INTRAMUSCULAR | Status: DC | PRN
  Administered 2016-01-20: 2 mg via INTRAVENOUS

## 2016-01-20 MED ORDER — BUPIVACAINE-EPINEPHRINE 0.5% -1:200000 IJ SOLN
INTRAMUSCULAR | Status: DC | PRN
  Administered 2016-01-20: 30 mL

## 2016-01-20 MED ORDER — BUPIVACAINE-EPINEPHRINE (PF) 0.5% -1:200000 IJ SOLN
INTRAMUSCULAR | Status: AC
  Filled 2016-01-20: qty 30

## 2016-01-20 MED ORDER — ROCURONIUM BROMIDE 100 MG/10ML IV SOLN
INTRAVENOUS | Status: DC | PRN
  Administered 2016-01-20: 50 mg via INTRAVENOUS
  Administered 2016-01-20: 10 mg via INTRAVENOUS

## 2016-01-20 SURGICAL SUPPLY — 78 items
BANDAGE ACE 4X5 VEL STRL LF (GAUZE/BANDAGES/DRESSINGS) ×3 IMPLANT
BANDAGE ACE 6X5 VEL STRL LF (GAUZE/BANDAGES/DRESSINGS) ×6 IMPLANT
BANDAGE ELASTIC 6 VELCRO ST LF (GAUZE/BANDAGES/DRESSINGS) ×1 IMPLANT
BANDAGE ESMARK 6X9 LF (GAUZE/BANDAGES/DRESSINGS) ×2 IMPLANT
BLADE CUDA 5.5 (BLADE) IMPLANT
BLADE GREAT WHITE 4.2 (BLADE) ×3 IMPLANT
BLADE SURG 11 STRL SS (BLADE) ×3 IMPLANT
BNDG CMPR 9X6 STRL LF SNTH (GAUZE/BANDAGES/DRESSINGS) ×2
BNDG ESMARK 6X9 LF (GAUZE/BANDAGES/DRESSINGS) ×3
BNDG GAUZE ELAST 4 BULKY (GAUZE/BANDAGES/DRESSINGS) ×5 IMPLANT
BRUSH SCRUB DISP (MISCELLANEOUS) ×6 IMPLANT
CLEANER TIP ELECTROSURG 2X2 (MISCELLANEOUS) ×3 IMPLANT
COVER SURGICAL LIGHT HANDLE (MISCELLANEOUS) ×6 IMPLANT
CUFF TOURNIQUET SINGLE 18IN (TOURNIQUET CUFF) IMPLANT
CUFF TOURNIQUET SINGLE 24IN (TOURNIQUET CUFF) IMPLANT
CUFF TOURNIQUET SINGLE 34IN LL (TOURNIQUET CUFF) ×1 IMPLANT
CUFF TOURNIQUET SINGLE 44IN (TOURNIQUET CUFF) IMPLANT
DRAPE ARTHROSCOPY W/POUCH 114 (DRAPES) ×3 IMPLANT
DRAPE C-ARM 42X72 X-RAY (DRAPES) IMPLANT
DRAPE C-ARMOR (DRAPES) ×3 IMPLANT
DRAPE INCISE IOBAN 66X45 STRL (DRAPES) ×1 IMPLANT
DRAPE ORTHO SPLIT 77X108 STRL (DRAPES) ×3
DRAPE SURG ORHT 6 SPLT 77X108 (DRAPES) IMPLANT
DRAPE U-SHAPE 47X51 STRL (DRAPES) ×3 IMPLANT
DRAPE UNIVERSAL PACK (DRAPES) ×1 IMPLANT
DRSG ADAPTIC 3X8 NADH LF (GAUZE/BANDAGES/DRESSINGS) ×3 IMPLANT
DRSG EMULSION OIL 3X3 NADH (GAUZE/BANDAGES/DRESSINGS) ×3 IMPLANT
DRSG MEPILEX BORDER 4X4 (GAUZE/BANDAGES/DRESSINGS) ×1 IMPLANT
DRSG PAD ABDOMINAL 8X10 ST (GAUZE/BANDAGES/DRESSINGS) ×6 IMPLANT
ELECT REM PT RETURN 9FT ADLT (ELECTROSURGICAL) ×3
ELECTRODE REM PT RTRN 9FT ADLT (ELECTROSURGICAL) ×2 IMPLANT
GAUZE SPONGE 4X4 12PLY STRL (GAUZE/BANDAGES/DRESSINGS) ×3 IMPLANT
GLOVE BIO SURGEON STRL SZ7.5 (GLOVE) ×3 IMPLANT
GLOVE BIO SURGEON STRL SZ8 (GLOVE) ×3 IMPLANT
GLOVE BIOGEL PI IND STRL 7.0 (GLOVE) IMPLANT
GLOVE BIOGEL PI IND STRL 7.5 (GLOVE) ×2 IMPLANT
GLOVE BIOGEL PI IND STRL 8 (GLOVE) ×2 IMPLANT
GLOVE BIOGEL PI INDICATOR 7.0 (GLOVE) ×3
GLOVE BIOGEL PI INDICATOR 7.5 (GLOVE) ×1
GLOVE BIOGEL PI INDICATOR 8 (GLOVE) ×2
GLOVE SURG SS PI 7.0 STRL IVOR (GLOVE) ×1 IMPLANT
GOWN STRL REUS W/ TWL LRG LVL3 (GOWN DISPOSABLE) ×4 IMPLANT
GOWN STRL REUS W/ TWL XL LVL3 (GOWN DISPOSABLE) ×2 IMPLANT
GOWN STRL REUS W/TWL LRG LVL3 (GOWN DISPOSABLE) ×6
GOWN STRL REUS W/TWL XL LVL3 (GOWN DISPOSABLE) ×3
KIT BASIN OR (CUSTOM PROCEDURE TRAY) ×3 IMPLANT
KIT ROOM TURNOVER OR (KITS) ×3 IMPLANT
MANIFOLD NEPTUNE II (INSTRUMENTS) ×3 IMPLANT
NEEDLE 22X1 1/2 (OR ONLY) (NEEDLE) ×1 IMPLANT
NS IRRIG 1000ML POUR BTL (IV SOLUTION) ×3 IMPLANT
PACK ARTHROSCOPY DSU (CUSTOM PROCEDURE TRAY) ×3 IMPLANT
PAD ARMBOARD 7.5X6 YLW CONV (MISCELLANEOUS) ×6 IMPLANT
PADDING CAST COTTON 6X4 STRL (CAST SUPPLIES) ×9 IMPLANT
SET ARTHROSCOPY TUBING (MISCELLANEOUS) ×3
SET ARTHROSCOPY TUBING LN (MISCELLANEOUS) ×2 IMPLANT
SPONGE GAUZE 4X4 12PLY STER LF (GAUZE/BANDAGES/DRESSINGS) ×1 IMPLANT
SPONGE LAP 18X18 X RAY DECT (DISPOSABLE) ×3 IMPLANT
SPONGE SCRUB IODOPHOR (GAUZE/BANDAGES/DRESSINGS) ×3 IMPLANT
STAPLER VISISTAT 35W (STAPLE) ×1 IMPLANT
STRIP CLOSURE SKIN 1/2X4 (GAUZE/BANDAGES/DRESSINGS) IMPLANT
SUCTION FRAZIER HANDLE 10FR (MISCELLANEOUS)
SUCTION TUBE FRAZIER 10FR DISP (MISCELLANEOUS) IMPLANT
SUT ETHILON 3 0 PS 1 (SUTURE) IMPLANT
SUT ETHILON 4 0 PS 2 18 (SUTURE) ×3 IMPLANT
SUT PDS AB 2-0 CT1 27 (SUTURE) IMPLANT
SUT VIC AB 0 CT1 27 (SUTURE)
SUT VIC AB 0 CT1 27XBRD ANBCTR (SUTURE) IMPLANT
SUT VIC AB 2-0 CT1 27 (SUTURE) ×3
SUT VIC AB 2-0 CT1 TAPERPNT 27 (SUTURE) IMPLANT
SYR BULB IRRIGATION 50ML (SYRINGE) ×1 IMPLANT
SYR CONTROL 10ML LL (SYRINGE) ×1 IMPLANT
TOWEL OR 17X24 6PK STRL BLUE (TOWEL DISPOSABLE) ×6 IMPLANT
TOWEL OR 17X26 10 PK STRL BLUE (TOWEL DISPOSABLE) ×6 IMPLANT
TUBE CONNECTING 12X1/4 (SUCTIONS) ×3 IMPLANT
UNDERPAD 30X30 (UNDERPADS AND DIAPERS) ×3 IMPLANT
WAND HAND CNTRL MULTIVAC 90 (MISCELLANEOUS) IMPLANT
WATER STERILE IRR 1000ML POUR (IV SOLUTION) ×5 IMPLANT
YANKAUER SUCT BULB TIP NO VENT (SUCTIONS) ×3 IMPLANT

## 2016-01-20 NOTE — Anesthesia Postprocedure Evaluation (Signed)
Anesthesia Post Note  Patient: ROSEBELLE EDGEWORTH  Procedure(s) Performed: Procedure(s) (LRB): HARDWARE REMOVAL LEFT FEMUR (Left) ARTHROSCOPY KNEE (Left)  Patient location during evaluation: PACU Anesthesia Type: General Level of consciousness: awake and alert Pain management: pain level controlled Vital Signs Assessment: post-procedure vital signs reviewed and stable Respiratory status: spontaneous breathing, nonlabored ventilation and respiratory function stable Cardiovascular status: blood pressure returned to baseline and stable Postop Assessment: no signs of nausea or vomiting Anesthetic complications: no    Last Vitals:  Vitals:   01/20/16 1645 01/20/16 1700  BP: 107/60 111/62  Pulse: (!) 58 60  Resp: 14 13  Temp:      Last Pain:  Vitals:   01/20/16 1645  TempSrc:   PainSc: Asleep                 Woodard Perrell A

## 2016-01-20 NOTE — OR Nursing (Signed)
Discharge instructions reviewed with patient and fiance Tim.  All questions answered.  Pt discharged into the care of Tim.

## 2016-01-20 NOTE — H&P (Signed)
Orthopaedic Trauma Service H&P  Chief Complaint:  Left knee pain  HPI:   60 y/o female well known to OTS after sustaining L femur fracture 02/2015. pts fracture has healed uneventfully but pt continues to have L knee pain. She also exhibits signs of meniscal pathology in her L knee. Pt presents today for [facility] and L knee arthroscopy. Unable to do MRI preop due to artifact from IMN.  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 Breast cancer ([place]) 2009   Lumpectomy, chemo and XRT  . Fracture, femur, supracondylar ([place]), left 02/12/2015  . Hypertension   . Pre-diabetes     diabetic coordinator confirmed  . Vitamin D deficiency 02/15/2015    Past Surgical History:  Procedure Laterality Date  . BREAST LUMPECTOMY Right 2009  . CARDIAC CATHETERIZATION N/A 07/29/2015   Procedure: Left Heart Cath and Cors/Grafts Angiography;  Surgeon: Leonie Man, MD;  Location: [facility];  Service: Cardiovascular;  Laterality: N/A;  . CARPAL TUNNEL RELEASE Right 2005  . FEMUR IM NAIL Left 02/12/2015   Procedure: INTRAMEDULLARY (IM) RETROGRADE FEMORAL NAILING;  Surgeon: Altamese [place], MD;  Location: [facility];  Service: Orthopedics;  Laterality: Left;    Family History  Problem Relation Age of Onset  . Heart attack     Social History:  reports that she has never smoked. She has never used smokeless tobacco. She reports that she does not drink alcohol or use drugs.  Allergies: No Known Allergies  Medications Prior to Admission  Medication Sig Dispense Refill  . aspirin EC 81 MG tablet Take 81 mg by mouth daily.    . calcium carbonate (OS-CAL - DOSED IN MG OF ELEMENTAL CALCIUM) 1250 (500 CA) MG tablet Take 1 tablet (500 mg of elemental calcium total) by mouth daily with breakfast. (Patient taking differently: Take 1 tablet by mouth 2 (two) times daily with a meal. ) 60 tablet 1  .  cholecalciferol 1000 UNITS tablet Take 1 tablet (1,000 Units total) by mouth 2 (two) times daily. (Patient taking differently: Take 1,000 Units by mouth See admin instructions. Every three days) 90 tablet 4  . methocarbamol (ROBAXIN) 500 MG tablet Take 500 mg by mouth 2 (two) times daily as needed for muscle spasms.    . metoprolol tartrate (LOPRESSOR) 25 MG tablet Take 0.5 tablets (12.5 mg total) by mouth 2 (two) times daily. 30 tablet 9  . nitroGLYCERIN (NITROSTAT) 0.4 MG SL tablet Place 1 tablet (0.4 mg total) under the tongue every 5 (five) minutes as needed for chest pain. 25 tablet 3  . OMEGA-3 FATTY ACIDS PO Take 1 capsule by mouth daily.    . phenazopyridine (PYRIDIUM) 200 MG tablet Take 200 mg by mouth 3 (three) times daily as needed for pain.    Marland Kitchen VITAMIN A PO Take 1 capsule by mouth daily.    . vitamin C (VITAMIN C) 500 MG tablet Take 1 tablet (500 mg total) by mouth daily. 60 tablet 2  . VITAMIN E PO Take 1 capsule by mouth daily.      Results for orders placed or performed during the hospital encounter of 01/20/16 (from the past 48 hour(s))  Glucose, capillary     Status: Abnormal   Collection Time: 01/20/16 10:14 AM  Result Value Ref Range   Glucose-Capillary 119 (H) 65 - 99 mg/dL   Comment 1 Notify RN  Comment 2 Document in Chart    No results found.  Review of Systems  Constitutional: Negative for chills and fever.  Respiratory: Negative for shortness of breath and wheezing.   Cardiovascular: Negative for chest pain and palpitations.  Gastrointestinal: Negative for nausea and vomiting.  Genitourinary: Negative for urgency.  Musculoskeletal: Positive for joint pain (Left knee).  Neurological: Negative for headaches.    Blood pressure (!) 145/75, pulse 74, temperature 99.1 F (37.3 C), temperature source Oral, resp. rate 20, height 5\' 4"  (1.626 m), weight 105.7 kg (233 lb), SpO2 98 %. Physical Exam  Constitutional: She is oriented to person, place, and time. She  appears well-nourished. No distress.  HENT:  Head: Normocephalic and atraumatic.  Eyes: EOM are normal. Pupils are equal, round, and reactive to light.  Cardiovascular: Normal rate and regular rhythm.   Respiratory: Effort normal. No respiratory distress.  GI: Soft. Bowel sounds are normal.  Musculoskeletal:  Left Lower Extremity     Surgical wounds well healed    nontender over fx site    TTP over distal locking bolts    + L knee joint line pain and pain with mcmurrays      DPN, SPN,TN sensation intact     EHL, FHL, lesser toe motor intact      Ankle flexion, extension, Inversion, eversion intact     + quad set      + active knee flexion and extension      Good knee ROM     No gross knee instability noted     Ext warm     + DP pulse   Neurological: She is alert and oriented to person, place, and time.  Skin: Skin is warm.  Psychiatric: She has a normal mood and affect.     Assessment/Plan  60 y/o female 11 months s/p IMN L femur fx with persistent knee pain   OR for [facility] L femur and L knee arthroscopy  Likely outpt vs 23 hour observe Do not anticipate restrictions post op  Pt understands risks and benefits of surgery and wishes to proceed  Jari Pigg, PA-C 01/20/2016, 11:25 AM

## 2016-01-20 NOTE — Brief Op Note (Signed)
01/20/2016  4:02 PM  PATIENT:  Isabella [name]  60 y.o. female  PRE-OPERATIVE DIAGNOSIS:   1. SYMPTOMATIC HARDWARE LEFT FEMUR 2. LEFT KNEE ARTHRITIS 3. SUSPECTED MENISCUS TEAR  POST-OPERATIVE DIAGNOSIS:  1. SYMPTOMATIC HARDWARE LEFT FEMUR 2. 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 LATERAL MENISCUS TEAR 4. LARGE LOOSE BODY 2.5cm X 1.5cm 5. EXTENSIVE SYNOVITIS  PROCEDURE:  Procedure(s): 1. REMOVAL OF DEEP IMPLANT LEFT FEMUR 2. CHONDROPLASTY PATELLOFEMORAL AND LATERAL COMPARTMENTS 3. PARTIAL LATERAL MENISCECTOMY  4. REMOVAL OF LARGE LOOSE BODY 2.5cm X 1.5cm 5. PARTIAL SYNOVECTOMY  SURGEON:  Surgeon(s) and Role:    * Altamese [place], MD - Primary  ASSISTANTS: none   ANESTHESIA:   general  EBL:  Total I/O In: 1000 [I.V.:1000] Out: -   BLOOD ADMINISTERED:none  DRAINS: none   LOCAL MEDICATIONS USED:  MARCAINE     SPECIMEN:  No Specimen  DISPOSITION OF SPECIMEN:  N/A  COUNTS:  YES   TOURNIQUET:  * No tourniquets in log *  DICTATION: .Other Dictation: Dictation Number NOT GIVEN! PHONES IN OR LOUNGE NEED REPLACING  PLAN OF CARE: Discharge to home after PACU  PATIENT DISPOSITION:  PACU - hemodynamically stable.   Delay start of Pharmacological VTE agent (>24hrs) due to surgical blood loss or risk of bleeding: no

## 2016-01-20 NOTE — Discharge Instructions (Signed)
Orthopaedic Trauma Service Discharge Instructions   General Discharge Instructions  WEIGHT BEARING STATUS: Weightbearing as tolerated  RANGE OF MOTION/ACTIVITY: range of motion as tolerated  Wound Care: daily wound care starting on 01/22/2016. See below    Discharge Wound Care Instructions  Do NOT apply any ointments, solutions or lotions to pin sites or surgical wounds.  These prevent needed drainage and even though solutions like hydrogen peroxide kill bacteria, they also damage cells lining the pin sites that help fight infection.  Applying lotions or ointments can keep the wounds moist and can cause them to breakdown and open up as well. This can increase the risk for infection. When in doubt call the office.  Surgical incisions should be dressed daily.  If any drainage is noted, use one layer of adaptic, then gauze, Kerlix, and an ace wrap.  Once the incision is completely dry and without drainage, it may be left open to air out.  Showering may begin 36-48 hours later.  Cleaning gently with soap and water.  Traumatic wounds should be dressed daily as well.    One layer of adaptic, gauze, Kerlix, then ace wrap.  The adaptic can be discontinued once the draining has ceased    If you have a wet to dry dressing: wet the gauze with saline the squeeze as much saline out so the gauze is moist (not soaking wet), place moistened gauze over wound, then place a dry gauze over the moist one, followed by Kerlix wrap, then ace wrap.    PAIN MEDICATION USE AND EXPECTATIONS  You have likely been given narcotic medications to help control your pain.  After a traumatic event that results in an fracture (broken bone) with or without surgery, it is ok to use narcotic pain medications to help control one's pain.  We understand that everyone responds to pain differently and each individual patient will be evaluated on a regular basis for the continued need for narcotic medications. Ideally, narcotic  medication use should last no more than 6-8 weeks (coinciding with fracture healing).   As a patient it is your responsibility as well to monitor narcotic medication use and report the amount and frequency you use these medications when you come to your office visit.   We would also advise that if you are using narcotic medications, you should take a dose prior to therapy to maximize you participation.  IF YOU ARE ON NARCOTIC MEDICATIONS IT IS NOT PERMISSIBLE TO OPERATE A MOTOR VEHICLE (MOTORCYCLE/CAR/TRUCK/MOPED) OR HEAVY MACHINERY DO NOT MIX NARCOTICS WITH OTHER CNS (CENTRAL NERVOUS SYSTEM) DEPRESSANTS SUCH AS ALCOHOL  Diet: as you were eating previously.  Can use over the counter stool softeners and bowel preparations, such as Miralax, to help with bowel movements.  Narcotics can be constipating.  Be sure to drink plenty of fluids    STOP SMOKING OR USING NICOTINE PRODUCTS!!!!  As discussed nicotine severely impairs your body's ability to heal surgical and traumatic wounds but also impairs bone healing.  Wounds and bone heal by forming microscopic blood vessels (angiogenesis) and nicotine is a vasoconstrictor (essentially, shrinks blood vessels).  Therefore, if vasoconstriction occurs to these microscopic blood vessels they essentially disappear and are unable to deliver necessary nutrients to the healing tissue.  This is one modifiable factor that you can do to dramatically increase your chances of healing your injury.    (This means no smoking, no nicotine gum, patches, etc)  DO NOT USE NONSTEROIDAL ANTI-INFLAMMATORY DRUGS (NSAID'S)  Using products such as  Advil (ibuprofen), Aleve (naproxen), Motrin (ibuprofen) for additional pain control during fracture healing can delay and/or prevent the healing response.  If you would like to take over the counter (OTC) medication, Tylenol (acetaminophen) is ok.  However, some narcotic medications that are given for pain control contain acetaminophen as  well. Therefore, you should not exceed more than 4000 mg of tylenol in a day if you do not have liver disease.  Also note that there are may OTC medicines, such as cold medicines and allergy medicines that my contain tylenol as well.  If you have any questions about medications and/or interactions please ask your doctor/PA or your pharmacist.      ICE AND ELEVATE INJURED/OPERATIVE EXTREMITY  Using ice and elevating the injured extremity above your heart can help with swelling and pain control.  Icing in a pulsatile fashion, such as 20 minutes on and 20 minutes off, can be followed.    Do not place ice directly on skin. Make sure there is a barrier between to skin and the ice pack.    Using frozen items such as frozen peas works well as the conform nicely to the are that needs to be iced.  USE AN ACE WRAP OR TED HOSE FOR SWELLING CONTROL  In addition to icing and elevation, Ace wraps or TED hose are used to help limit and resolve swelling.  It is recommended to use Ace wraps or TED hose until you are informed to stop.    When using Ace Wraps start the wrapping distally (farthest away from the body) and wrap proximally (closer to the body)   Example: If you had surgery on your leg or thing and you do not have a splint on, start the ace wrap at the toes and work your way up to the thigh        If you had surgery on your upper extremity and do not have a splint on, start the ace wrap at your fingers and work your way up to the upper arm  IF YOU ARE IN A SPLINT OR CAST DO NOT [place]   If your splint gets wet for any reason please contact the office immediately. You may shower in your splint or cast as long as you keep it dry.  This can be done by wrapping in a cast cover or garbage back (or similar)  Do Not stick any thing down your splint or cast such as pencils, money, or hangers to try and scratch yourself with.  If you feel itchy take benadryl as prescribed on the bottle for  itching  IF YOU ARE IN A CAM BOOT (BLACK BOOT)  You may remove boot periodically. Perform daily dressing changes as noted below.  Wash the liner of the boot regularly and wear a sock when wearing the boot. It is recommended that you sleep in the boot until told otherwise  CALL THE OFFICE WITH ANY QUESTIONS OR CONCERNS: [phone]

## 2016-01-20 NOTE — Transfer of Care (Signed)
Immediate Anesthesia Transfer of Care Note  Patient: Isabella [name]  Procedure(s) Performed: Procedure(s): HARDWARE REMOVAL LEFT FEMUR (Left) ARTHROSCOPY KNEE (Left)  Patient Location: PACU  Anesthesia Type:General  Level of Consciousness: awake and alert   Airway & Oxygen Therapy: Patient Spontanous Breathing and Patient connected to nasal cannula oxygen  Post-op Assessment: Report given to RN and Post -op Vital signs reviewed and stable  Post vital signs: Reviewed and stable  Last Vitals:  Vitals:   01/20/16 1010 01/20/16 1604  BP: (!) 145/75 99/65  Pulse: 74 71  Resp: 20 14  Temp: 37.3 C 36.6 C    Last Pain:  Vitals:   01/20/16 1604  TempSrc:   PainSc: 0-No pain      Patients Stated Pain Goal: 7 ([id number])  Complications: No apparent anesthesia complications

## 2016-01-20 NOTE — Anesthesia Procedure Notes (Signed)
Procedure Name: Intubation Date/Time: 01/20/2016 1:23 PM Performed by: Oletta Lamas Pre-anesthesia Checklist: Patient identified, Emergency Drugs available, Suction available and Patient being monitored Patient Re-evaluated:Patient Re-evaluated prior to inductionOxygen Delivery Method: Circle System Utilized Preoxygenation: Pre-oxygenation with 100% oxygen Intubation Type: IV induction Ventilation: Mask ventilation without difficulty Laryngoscope Size: Mac, 4 and 3 Grade View: Grade I Tube size: 7.5 mm Number of attempts: 1 Airway Equipment and Method: Stylet and Oral airway Placement Confirmation: ETT inserted through vocal cords under direct vision,  positive ETCO2 and breath sounds checked- equal and bilateral Secured at: 22 cm Tube secured with: Tape Dental Injury: Teeth and Oropharynx as per pre-operative assessment  Difficulty Due To: Difficulty was unanticipated

## 2016-01-20 NOTE — Anesthesia Preprocedure Evaluation (Addendum)
Anesthesia Evaluation  Patient identified by MRN, date of birth, ID band Patient awake    Reviewed: Allergy & Precautions, NPO status , Patient's Chart, lab work & pertinent test results  Airway Mallampati: II  TM Distance: >3 FB Neck ROM: Full    Dental  (+) Teeth Intact, Dental Advisory Given   Pulmonary    breath sounds clear to auscultation       Cardiovascular hypertension,  Rhythm:Regular Rate:Normal     Neuro/Psych    GI/Hepatic   Endo/Other    Renal/GU      Musculoskeletal   Abdominal   Peds  Hematology   Anesthesia Other Findings   Reproductive/Obstetrics                             Anesthesia Physical Anesthesia Plan  ASA: III  Anesthesia Plan: General   Post-op Pain Management:    Induction: Intravenous  Airway Management Planned: Oral ETT  Additional Equipment:   Intra-op Plan:   Post-operative Plan: Extubation in OR  Informed Consent: I have reviewed the patients History and Physical, chart, labs and discussed the procedure including the risks, benefits and alternatives for the proposed anesthesia with the patient or authorized representative who has indicated his/her understanding and acceptance.   Dental advisory given  Plan Discussed with: CRNA and Anesthesiologist  Anesthesia Plan Comments:         Anesthesia Quick Evaluation  

## 2016-01-21 ENCOUNTER — Encounter (HOSPITAL_COMMUNITY): Payer: Self-pay | Admitting: Orthopedic Surgery

## 2016-01-21 LAB — VITAMIN D 25 HYDROXY (VIT D DEFICIENCY, FRACTURES): VIT D 25 HYDROXY: 15.1 ng/mL — AB (ref 30.0–100.0)

## 2016-01-21 NOTE — Op Note (Signed)
Isabella [name], Isabella [name]                 ACCOUNT NO.:  [id number]  MEDICAL RECORD NO.:  [id number]  LOCATION:  [facility]                         FACILITY:  [facility]  PHYSICIAN:  Astrid Divine. Marcelino Scot, M.D. DATE OF BIRTH:  12-29-1955  DATE OF PROCEDURE:  01/20/2016 DATE OF DISCHARGE:  01/20/2016                              OPERATIVE REPORT   PREOPERATIVE DIAGNOSES: 1. Symptomatic hardware, left femur. 2. Left knee arthritis. 3. Suspected meniscus tear.  POSTOPERATIVE DIAGNOSES: 1. Symptomatic hardware, left femur. 2. Severe grade 4 left knee patellofemoral arthritis, extensive grade     3 lateral compartment arthritis on the tibial surface. 3. Medial meniscus tear of anterior horn with associated synovitis. 4. Lateral meniscus tear involving anterior horn. 5. Large loose body, 2.5 cm x 1.5 cm. 6. Extensive synovitis.  PROCEDURES: 1. Removal of deep implant, left femur. 2. Chondroplasty of patellofemoral and lateral compartments. 3. Medial and lateral partial meniscectomies. 4. Removal of large loose body through a separate incision 2.5 cm x     1.5 cm. 5. Partial synovectomy, left knee.  SURGEON:  Astrid Divine. Marcelino Scot, M.D.  ASSISTANT:  None.  ANESTHESIA:  General.  COMPLICATIONS:  None.  DRAINS:  None.  LOCAL MEDICATION:  Marcaine.  DISPOSITION:  To PACU.  CONDITION:  Stable.  BRIEF SUMMARY AND INDICATION FOR PROCEDURE:  The patient is a 60 year old status post supracondylar femur fracture treated with retrograde intramedullary nailing.  The patient went on to unite the fracture but has had persistent problems with regaining knee function and has had persistent mechanical symptoms including giving way and locking.  I did discuss with her the risks and benefits of removal including the potential for occult nonunion, failure to alleviate symptoms, need for further surgery including total knee arthroplasty in the event that the arthritis was severe and multi-compartment.  We also  discussed infection, nerve injury, vessel injury, DVT, PE, loss of motion, and others.  She did wish to proceed.  BRIEF SUMMARY OF PROCEDURE:  The patient was taken to the operating room where general anesthesia was induced.  Her left lower extremity was prepped and draped in usual sterile fashion.  We began by after establishing location of the incisions we placed the nail and this was marked.  We then made the 2 incisions for introduction of the arthroscopic instruments.  Full diagnostic arthroscopy was performed and we identified several loose bodies most of which were able to be debrided with the arthroscopic shaver, however, 1 had to be grasped with a pituitary as it was rather extensive and large measuring 2.5 cm x 1.5 cm.  I used a 15 blade and then an 11 blade to make an incision measuring nearly 3 cm in length just to extract it from the knee joint. Fortunately, we were able to continue with the arthroscopy and performed extensive synovectomy both of the medial compartment where the synovitis protruded out between the joint surfaces and in particular over the anterior horn of the medial meniscus, which was torn, as well as the patellofemoral joint where it was seen catching between the patellofemoral surfaces.  Removal of the synovitis revealed grade 4 changes over the trochlea on both the medial  and lateral aspects of the distal femur.  The patella had extensive grade 3 and 4 changes as well.  After debridement of the anterior horn tear, the medial meniscus was Found to be otherwise intact.  The anterior horn of lateral meniscus had avulsed and at the far medial margin was flipped into the joint.  Consequently, this had to be debrided using arthroscopic biter and then shaver getting back to a stable surface.  Once this extensive tearing was removed, we then identified large areas of grade 3 arthritic changes over the tibial surface on which was performed a chondroplasty.   Arthroscopic shaver was used to perform chondroplasty of the trochlea and the patella as well. The knee was then lavaged once more and instruments removed.  Attention was then turned to the implants where C-arm was used to make sure that the incisions lined up with the root for extraction.  Then, all incisions were re-made and we left distal screw partly engaged to prevent rotation during engagement of the extraction device.  A midline longitudinal incision was remade to the old surgical scar.  Retinacular incision was remade and the extraction device engaged into the nail. The remaining screw was then removed and the nail extracted without difficulty.  The knee was irrigated.  There was no sign of purulence or infection of any kind.  All wounds were irrigated and then closed in standard layered fashion using Vicryl for the peri-retinacular incision, 2-0 Vicryl for the subcu and 3-0 nylon for the skin, 3-0 nylon and Vicryl were used for the other loose body extraction incision and then 3- 0 nylon for the locking bolt sites.  Knee had excellent range of motion. The incisions were injected with Marcaine with epi and this included an additional 6 mL into the joint.  Sterile gently compressive dressing was applied.  The patient was then awakened from anesthesia, transported to the PACU in stable condition.  PROGNOSIS:  The patient will be weightbearing as tolerated with unrestricted range of motion of the knee.  Encouraged a high-resolution and low impact program.  We will plan see her back in the office in 10 days for further evaluation.  I would anticipate a total knee arthroplasty but with removal of this by mechanical source of pain and instability, the function should be greatly improved.  She may be a candidate for early steroid injection to get some more distance of the knee as well.    Astrid Divine. Marcelino Scot, M.D.    MHH/MEDQ  D:  01/20/2016  T:  01/21/2016  Job:  [id number]

## 2016-08-05 ENCOUNTER — Encounter (INDEPENDENT_AMBULATORY_CARE_PROVIDER_SITE_OTHER): Payer: Self-pay

## 2016-08-05 ENCOUNTER — Ambulatory Visit (INDEPENDENT_AMBULATORY_CARE_PROVIDER_SITE_OTHER): Payer: Medicaid Other

## 2016-08-05 ENCOUNTER — Ambulatory Visit (INDEPENDENT_AMBULATORY_CARE_PROVIDER_SITE_OTHER): Payer: Medicaid Other | Admitting: Orthopaedic Surgery

## 2016-08-05 DIAGNOSIS — M25562 Pain in left knee: Secondary | ICD-10-CM | POA: Diagnosis not present

## 2016-08-05 DIAGNOSIS — G8929 Other chronic pain: Secondary | ICD-10-CM | POA: Diagnosis not present

## 2016-08-05 NOTE — Progress Notes (Signed)
Office Visit Note   Patient: Isabella [name]           Date of Birth: 11/13/1955           MRN: [id number] Visit Date: 08/05/2016              Requested by: No referring provider defined for this encounter. PCP: No PCP Per Patient   Assessment & Plan: Visit Diagnoses:  1. Chronic pain of left knee     Plan: Given the fact that she says her knee locks and catches and gives way and her x-rays show still decently maintained joint space it's essential we obtain an MRI of her knee to rule out a ligamentous or cartilaginous injury or injury to the meniscus. I'm not convinced that she needs a knee replacement and she may just need arthroscopic intervention and extensive physical therapy. She'll still use her cane for now and have her take anti-inflammatories as needed. I'll see her back in 2 weeks and hopefully MRI will have been obtained of her left knee.  Follow-Up Instructions: Return in about 2 weeks (around 08/19/2016).   Orders:  Orders Placed This Encounter  Procedures  . XR Knee 1-2 Views Left   No orders of the defined types were placed in this encounter.     Procedures: No procedures performed   Clinical Data: No additional findings.   Subjective: No chief complaint on file. The patient is a 61 year old female referred from Dr. Marcelino Scot orthopedic trauma specialist in town to evaluate her left knee for the potential for a knee replacement. She has accommodated history and the fact that she sustained a distal third shaft fracture sometime last year. She eventually healed the fracture and have the rod removed. She still has swelling all around her leg she states as well as difficulty ambulate. She says the knee gives way on her. She said the pain is constant. She points to the IT band in the thigh him of the knee as source of her pain. The knee has had a intra-articular steroid injection should she said did not help a single bit and seemed to make things worse and was very painful.  She says she's had extensive workup while her leg is swollen still. She was sent to me for further rotation treatment of the left knee at this point.  HPI  Review of Systems Currently denies any chest pain, headache, shortness of breath, fever, chills, nausea, vomiting.  Objective: Vital Signs: There were no vitals taken for this visit.  Physical Exam He is alert 3 and in no acute distress. She him most with a cane. Ortho Exam As far as gross inspection of the left lower extremity I do not see an extensive amount of swelling in terms of difference of the size of that leg compared to the other side. She is well-healed surgical incision at the knee itself. There is no significant knee joint effusion all. She hasn't a lot of pain over the IT band itself. She has good range of motion knee but it's hard to get a good exam causes global tenderness. Started tell whether there is truly a McMurray's exam that is positive or Lockman's exam. Specialty Comments:  No specialty comments available.  Imaging: Xr Knee 1-2 Views Left  Result Date: 08/05/2016 2 views left knee show significant patellofemoral arthritic changes but well maintained medial lateral compartments. The distal third femoral shaft fracture is healed completely. There is no retained hardware at this point.  PMFS History: Patient Active Problem List   Diagnosis Date Noted  . Chest pain with moderate risk of acute coronary syndrome 07/24/2015  . Dyslipidemia 07/24/2015  . Strong family history of coronary artery disease 07/24/2015  . History of breast cancer 07/24/2015  . Vitamin D deficiency 02/15/2015  . Fall from truck  02/15/2015  . Diabetes mellitus type 2 in obese ([place]) 02/14/2015  . Fracture, femur, supracondylar ([place]), left 02/12/2015  . Chest pain on exertion 01/01/2015  . Anxiety 01/01/2015  . Acute hyperglycemia 01/01/2015   Past Medical History:  Diagnosis Date  . Breast cancer ([place]) 2009   Lumpectomy, chemo  and XRT  . Fracture, femur, supracondylar ([place]), left 02/12/2015  . Hypertension   . Pre-diabetes     diabetic coordinator confirmed  . Vitamin D deficiency 02/15/2015    Family History  Problem Relation Age of Onset  . Heart attack      Past Surgical History:  Procedure Laterality Date  . BREAST LUMPECTOMY Right 2009  . CARDIAC CATHETERIZATION N/A 07/29/2015   Procedure: Left Heart Cath and Cors/Grafts Angiography;  Surgeon: Leonie Man, MD;  Location: [facility];  Service: Cardiovascular;  Laterality: N/A;  . CARPAL TUNNEL RELEASE Right 2005  . FEMUR IM NAIL Left 02/12/2015   Procedure: INTRAMEDULLARY (IM) RETROGRADE FEMORAL NAILING;  Surgeon: Altamese Berks, MD;  Location: [facility];  Service: Orthopedics;  Laterality: Left;  . HARDWARE REMOVAL Left 01/20/2016   Procedure: HARDWARE REMOVAL LEFT FEMUR;  Surgeon: Altamese Butler, MD;  Location: [facility];  Service: Orthopedics;  Laterality: Left;  . KNEE ARTHROSCOPY Left 01/20/2016   Procedure: ARTHROSCOPY KNEE;  Surgeon: Altamese , MD;  Location: [facility];  Service: Orthopedics;  Laterality: Left;   Social History   Occupational History  . Not on file.   Social History Main Topics  . Smoking status: Never Smoker  . Smokeless tobacco: Never Used  . Alcohol use No  . Drug use: No  . Sexual activity: Not on file

## 2016-08-06 ENCOUNTER — Other Ambulatory Visit (INDEPENDENT_AMBULATORY_CARE_PROVIDER_SITE_OTHER): Payer: Self-pay

## 2016-08-06 DIAGNOSIS — M25562 Pain in left knee: Principal | ICD-10-CM

## 2016-08-06 DIAGNOSIS — G8929 Other chronic pain: Secondary | ICD-10-CM

## 2016-08-14 ENCOUNTER — Other Ambulatory Visit: Payer: Self-pay | Admitting: Internal Medicine

## 2016-08-14 DIAGNOSIS — Z1231 Encounter for screening mammogram for malignant neoplasm of breast: Secondary | ICD-10-CM

## 2016-08-24 ENCOUNTER — Ambulatory Visit (INDEPENDENT_AMBULATORY_CARE_PROVIDER_SITE_OTHER): Payer: Medicaid Other | Admitting: Orthopaedic Surgery

## 2016-08-24 ENCOUNTER — Other Ambulatory Visit: Payer: Medicaid Other

## 2016-08-28 ENCOUNTER — Ambulatory Visit
Admission: RE | Admit: 2016-08-28 | Discharge: 2016-08-28 | Disposition: A | Discharge status: Home / Self Care | Payer: Medicaid Other | Source: Ambulatory Visit | Attending: Orthopaedic Surgery | Admitting: Orthopaedic Surgery

## 2016-08-28 DIAGNOSIS — M25562 Pain in left knee: Principal | ICD-10-CM

## 2016-08-28 DIAGNOSIS — G8929 Other chronic pain: Secondary | ICD-10-CM

## 2016-08-31 ENCOUNTER — Other Ambulatory Visit: Payer: Medicaid Other

## 2016-09-01 ENCOUNTER — Ambulatory Visit (INDEPENDENT_AMBULATORY_CARE_PROVIDER_SITE_OTHER): Payer: Medicaid Other | Admitting: Orthopaedic Surgery

## 2016-09-01 ENCOUNTER — Encounter (INDEPENDENT_AMBULATORY_CARE_PROVIDER_SITE_OTHER): Payer: Self-pay | Admitting: Orthopaedic Surgery

## 2016-09-01 VITALS — Ht 64.0 in | Wt 233.0 lb

## 2016-09-01 DIAGNOSIS — G8929 Other chronic pain: Secondary | ICD-10-CM | POA: Insufficient documentation

## 2016-09-01 DIAGNOSIS — S83272A Complex tear of lateral meniscus, current injury, left knee, initial encounter: Secondary | ICD-10-CM | POA: Diagnosis not present

## 2016-09-01 DIAGNOSIS — M25562 Pain in left knee: Secondary | ICD-10-CM

## 2016-09-01 NOTE — Progress Notes (Signed)
The patient is following up to go over MRI of her left knee. Again she had a remote distal femoral shaft fracture that another surgeon in town placed a retrograde intramedullary nail to fix the fracture. He ended up eventually removing the hardware that was symptomatic. Her complaints are lateral knee pain as well as just weakness in the knee and it locking and giving way on her. We sent her for an MRI after x-ray showed only mild arthritic changes in the knee. She's here for review this today. She's been unable to go to physical therapy because Medicaid doesn't allow for any extensive physical therapy at all. She had placement a cane in her pain is daily and her left knee. Again she feels like it gives way on her as well.  The knee is ligaments stable on exam. Is globally tender though there is no effusion. MRI shows moderate cartilage thinning throughout the knee but no full-thickness cartilage defects. There may be an osteochondral fragment behind the PCL and there is a free edge tear lateral meniscus but no clear deficits.  We had a long and thorough discussion about her knee. She is convinced that there is something going on in her knee enough is causing her all these problems and is requesting arthroscopic intervention. I can give her no guarantees that this would clear up all her problems and she would deathly need a rehabilitation program with quad strengthening exercises amounts coronation following this. All questions were encouraged and answered and gave her copies MRI report. We'll work on setting her up for an arthroscopy. The risks and benefits of this were explained in detail with a thorough discussion about what the goals of surgery be as well as what she would need to do postoperative. We'll see her back in one week postoperative for suture removal.

## 2016-10-01 DIAGNOSIS — S83272A Complex tear of lateral meniscus, current injury, left knee, initial encounter: Secondary | ICD-10-CM | POA: Diagnosis not present

## 2016-10-05 ENCOUNTER — Ambulatory Visit
Admission: RE | Admit: 2016-10-05 | Discharge: 2016-10-05 | Disposition: A | Discharge status: Home / Self Care | Payer: Medicaid Other | Source: Ambulatory Visit | Attending: Internal Medicine | Admitting: Internal Medicine

## 2016-10-05 DIAGNOSIS — Z1231 Encounter for screening mammogram for malignant neoplasm of breast: Secondary | ICD-10-CM

## 2016-10-05 HISTORY — DX: Personal history of antineoplastic chemotherapy: Z92.21

## 2016-10-05 HISTORY — DX: Personal history of irradiation: Z92.3

## 2016-10-08 ENCOUNTER — Inpatient Hospital Stay (INDEPENDENT_AMBULATORY_CARE_PROVIDER_SITE_OTHER): Payer: Medicaid Other | Admitting: Orthopaedic Surgery

## 2016-10-22 ENCOUNTER — Ambulatory Visit (INDEPENDENT_AMBULATORY_CARE_PROVIDER_SITE_OTHER): Payer: Medicaid Other | Admitting: Orthopaedic Surgery

## 2016-10-22 DIAGNOSIS — Z9889 Other specified postprocedural states: Secondary | ICD-10-CM | POA: Insufficient documentation

## 2016-10-22 NOTE — Progress Notes (Signed)
Isabella [name] is now 3 weeks out from a left knee arthroscopy and a partial lateral meniscectomy. I have her arthroscopic pictures to show her the knee. She has intact cartilage on the medial and lateral compartments of her knee. Anterior cruciate ligament was intact as well. The medial meniscus looked great. The lateral meniscus had a small cleavage type of tear at the far lateral aspect. 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 She says she is doing well overall but is working on strength in her knee. She has ability to pass a [employer] physical and she says she can do that yet. She does have Medicaid they will allow any physical therapy. I did show her quad training will help. On examination of her knee her incisions look good from of the incisions. There is no significant effusion. Her range of motion is new. Over this [name] she'll continue work on home exercise program and we'll give her a note to keep her out of work until her quads are strong enough to support her going back to work. I'll reevaluate her in 4 weeks.

## 2016-11-23 ENCOUNTER — Ambulatory Visit (INDEPENDENT_AMBULATORY_CARE_PROVIDER_SITE_OTHER): Payer: Medicaid Other | Admitting: Orthopaedic Surgery

## 2016-12-08 ENCOUNTER — Ambulatory Visit (INDEPENDENT_AMBULATORY_CARE_PROVIDER_SITE_OTHER): Payer: Medicaid Other | Admitting: Physician Assistant

## 2016-12-08 DIAGNOSIS — Z9889 Other specified postprocedural states: Secondary | ICD-10-CM

## 2016-12-08 MED ORDER — MELOXICAM 7.5 MG PO TABS: 7.5000 mg | ORAL_TABLET | Freq: Two times a day (BID) | ORAL | 1 refills | Status: DC

## 2016-12-08 NOTE — Progress Notes (Signed)
Miss Oconnor returns today follow-up of her left knee status post knee arthroscopy 10/01/2016 with partial lateral meniscectomy. She states she is slowly proving. She does have some scar tissue left knee medial port site. She can't jog around with her grandchildren when she used too for her injuries. 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 She's taking no narcotics. She is asking for an anti-inflammatory  Physical exam: Left knee full extension election beyond 100. Medial port site with some scar tissue. No instability especially stressing. Calf supple nontender.  Impression status post left knee arthroscopy with partial lateral meniscectomy 10/01/2016.  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 Discussed with her natural anti-inflammatories will also place her on Mobic7.5 by mouth twice a day. Scar tissue mobilization. 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 Follow with [place] in 1 month check progress lack of.

## 2016-12-11 ENCOUNTER — Telehealth (INDEPENDENT_AMBULATORY_CARE_PROVIDER_SITE_OTHER): Payer: Self-pay

## 2016-12-11 NOTE — Telephone Encounter (Signed)
Patient called stating that the wrong start date for her out of work note is incorrect.  The correct date should be 02/12/2015-12/08/16.  She would like for correct note to be faxed to [phone] and mailed to her address on file.  Cb# is [phone].  Please advise.  Thank You.

## 2016-12-15 NOTE — Telephone Encounter (Signed)
Is this note ok to write? I didn't see her in [facility], which we had in 2016

## 2016-12-15 NOTE — Telephone Encounter (Signed)
She was seen be another Ortho MD in town (Dr. Marcelino Scot) for trauma to that same leg and then he eventually sent her to [place].  Ok to give a note with those dates.

## 2016-12-16 ENCOUNTER — Encounter (INDEPENDENT_AMBULATORY_CARE_PROVIDER_SITE_OTHER): Payer: Self-pay

## 2016-12-16 NOTE — Telephone Encounter (Signed)
Faxed to provided number  

## 2017-02-26 ENCOUNTER — Encounter (INDEPENDENT_AMBULATORY_CARE_PROVIDER_SITE_OTHER): Payer: Self-pay | Admitting: Radiology

## 2018-01-14 ENCOUNTER — Emergency Department (HOSPITAL_COMMUNITY)
Admission: EM | Admit: 2018-01-14 | Discharge: 2018-01-15 | Disposition: A | Discharge status: Home / Self Care | Payer: Medicaid Other | Attending: Emergency Medicine | Admitting: Emergency Medicine

## 2018-01-14 ENCOUNTER — Encounter (HOSPITAL_COMMUNITY): Payer: Self-pay

## 2018-01-14 ENCOUNTER — Other Ambulatory Visit: Payer: Self-pay

## 2018-01-14 DIAGNOSIS — Y9389 Activity, other specified: Secondary | ICD-10-CM | POA: Diagnosis not present

## 2018-01-14 DIAGNOSIS — Y92009 Unspecified place in unspecified non-institutional (private) residence as the place of occurrence of the external cause: Secondary | ICD-10-CM | POA: Insufficient documentation

## 2018-01-14 DIAGNOSIS — S60221A Contusion of right hand, initial encounter: Secondary | ICD-10-CM | POA: Diagnosis not present

## 2018-01-14 DIAGNOSIS — S6991XA Unspecified injury of right wrist, hand and finger(s), initial encounter: Secondary | ICD-10-CM | POA: Diagnosis present

## 2018-01-14 DIAGNOSIS — Z853 Personal history of malignant neoplasm of breast: Secondary | ICD-10-CM | POA: Diagnosis not present

## 2018-01-14 DIAGNOSIS — Y999 Unspecified external cause status: Secondary | ICD-10-CM | POA: Insufficient documentation

## 2018-01-14 DIAGNOSIS — E119 Type 2 diabetes mellitus without complications: Secondary | ICD-10-CM | POA: Diagnosis not present

## 2018-01-14 DIAGNOSIS — W208XXA Other cause of strike by thrown, projected or falling object, initial encounter: Secondary | ICD-10-CM | POA: Insufficient documentation

## 2018-01-14 DIAGNOSIS — Z7982 Long term (current) use of aspirin: Secondary | ICD-10-CM | POA: Diagnosis not present

## 2018-01-14 DIAGNOSIS — S60211A Contusion of right wrist, initial encounter: Secondary | ICD-10-CM | POA: Insufficient documentation

## 2018-01-14 NOTE — ED Triage Notes (Signed)
Pt had a toolbox lid slammed down onto her right wrist and forearm.

## 2018-01-15 ENCOUNTER — Emergency Department (HOSPITAL_COMMUNITY): Payer: Medicaid Other

## 2018-01-15 NOTE — Discharge Instructions (Signed)
Return if any problems.

## 2018-01-15 NOTE — ED Provider Notes (Signed)
[facility] EMERGENCY DEPARTMENT Provider Note   CSN: [id number] Arrival date & time: 01/14/18  2332     History   Chief Complaint Chief Complaint  Patient presents with  . Wrist Injury    HPI Isabella [name] is a 63 y.o. female.  The history is provided by the patient. No language interpreter was used.  Wrist Injury   The incident occurred 3 to 5 hours ago. The incident occurred at home. The injury mechanism was a direct blow and compression. The pain is present in the right hand and right wrist. The quality of the pain is described as aching. The pain is moderate. The pain has been constant since the incident. The symptoms are aggravated by movement. She has tried nothing for the symptoms. The treatment provided mild relief.  Pt complains of pain to her hand and wrist after truck tool box lid was slammed shut on her hand   Past Medical History:  Diagnosis Date  . Breast cancer ([place]) 2009   Lumpectomy, chemo and XRT  . Fracture, femur, supracondylar ([place]), left 02/12/2015  . Hypertension   . Personal history of chemotherapy   . Personal history of radiation therapy   . Pre-diabetes     diabetic coordinator confirmed  . Vitamin D deficiency 02/15/2015    Patient Active Problem List   Diagnosis Date Noted  . Status post arthroscopy of left knee 10/22/2016  . Complex tear of lateral meniscus of left knee as current injury 09/01/2016  . Chronic pain of left knee 09/01/2016  . Chest pain with moderate risk of acute coronary syndrome 07/24/2015  . Dyslipidemia 07/24/2015  . Strong family history of coronary artery disease 07/24/2015  . History of breast cancer 07/24/2015  . Vitamin D deficiency 02/15/2015  . Fall from truck  02/15/2015  . Diabetes mellitus type 2 in obese ([place]) 02/14/2015  . Fracture, femur, supracondylar ([place]), left 02/12/2015  . Chest pain on exertion 01/01/2015  . Anxiety 01/01/2015  . Acute hyperglycemia 01/01/2015    Past Surgical History:    Procedure Laterality Date  . BREAST LUMPECTOMY Right 2009  . CARDIAC CATHETERIZATION N/A 07/29/2015   Procedure: Left Heart Cath and Cors/Grafts Angiography;  Surgeon: Leonie Man, MD;  Location: [facility];  Service: Cardiovascular;  Laterality: N/A;  . CARPAL TUNNEL RELEASE Right 2005  . FEMUR IM NAIL Left 02/12/2015   Procedure: INTRAMEDULLARY (IM) RETROGRADE FEMORAL NAILING;  Surgeon: Altamese [place], MD;  Location: [facility];  Service: Orthopedics;  Laterality: Left;  . HARDWARE REMOVAL Left 01/20/2016   Procedure: HARDWARE REMOVAL LEFT FEMUR;  Surgeon: Altamese [place], MD;  Location: [facility];  Service: Orthopedics;  Laterality: Left;  . KNEE ARTHROSCOPY Left 01/20/2016   Procedure: ARTHROSCOPY KNEE;  Surgeon: Altamese , MD;  Location: [facility];  Service: Orthopedics;  Laterality: Left;     OB History   None      Home Medications    Prior to Admission medications   Medication Sig Start Date End Date Taking? Authorizing Provider  aspirin EC 81 MG tablet Take 81 mg by mouth daily.    [provider]  calcium carbonate (OS-CAL - DOSED IN MG OF ELEMENTAL CALCIUM) 1250 (500 CA) MG tablet Take 1 tablet (500 mg of elemental calcium total) by mouth daily with breakfast. Patient taking differently: Take 1 tablet by mouth 2 (two) times daily with a meal.  02/15/15   Ainsley Spinner, PA-C  cholecalciferol 1000 UNITS tablet Take 1 tablet (1,000  Units total) by mouth 2 (two) times daily. Patient taking differently: Take 1,000 Units by mouth See admin instructions. Every three days 02/15/15   Ainsley Spinner, PA-C  ketorolac (TORADOL) 10 MG tablet Take 1 tablet (10 mg total) by mouth every 6 (six) hours as needed for moderate pain. 01/20/16   Ainsley Spinner, PA-C  meloxicam (MOBIC) 7.5 MG tablet Take 1 tablet (7.5 mg total) by mouth 2 (two) times daily. 12/08/16   Pete Pelt, PA-C  methocarbamol (ROBAXIN) 500 MG tablet Take 500 mg by mouth 2 (two) times daily as needed for muscle spasms.     [provider]  metoprolol tartrate (LOPRESSOR) 25 MG tablet Take 0.5 tablets (12.5 mg total) by mouth 2 (two) times daily. 07/24/15   Erlene Quan, PA-C  nitroGLYCERIN (NITROSTAT) 0.4 MG SL tablet Place 1 tablet (0.4 mg total) under the tongue every 5 (five) minutes as needed for chest pain. 07/24/15   Erlene Quan, PA-C  OMEGA-3 FATTY ACIDS PO Take 1 capsule by mouth daily.    [provider]  oxyCODONE-acetaminophen (ROXICET) 5-325 MG tablet Take 1-2 tablets by mouth every 8 (eight) hours as needed for severe pain. 01/20/16   Ainsley Spinner, PA-C  phenazopyridine (PYRIDIUM) 200 MG tablet Take 200 mg by mouth 3 (three) times daily as needed for pain.    [provider]  promethazine (PHENERGAN) 12.5 MG tablet Take 1-2 tablets (12.5-25 mg total) by mouth every 6 (six) hours as needed for nausea or vomiting. 01/20/16   Ainsley Spinner, PA-C  VITAMIN A PO Take 1 capsule by mouth daily.    [provider]  vitamin C (VITAMIN C) 500 MG tablet Take 1 tablet (500 mg total) by mouth daily. 02/15/15   Ainsley Spinner, PA-C  VITAMIN E PO Take 1 capsule by mouth daily.    [provider]    Family History Family History  Problem Relation Age of Onset  . Heart attack Unknown     Social History Social History   Tobacco Use  . Smoking status: Never Smoker  . Smokeless tobacco: Never Used  Substance Use Topics  . Alcohol use: No  . Drug use: No     Allergies   Patient has no known allergies.   Review of Systems Review of Systems  All other systems reviewed and are negative.    Physical Exam Updated Vital Signs BP (!) 155/84 (BP Location: Left Arm)   Pulse 78   Temp 97.8 F (36.6 C) (Oral)   Resp 16   Ht 5\' 4"  (1.626 m)   SpO2 98%   BMI 39.99 kg/m   Physical Exam  Constitutional: She appears well-developed and well-nourished.  HENT:  Head: Normocephalic.  Musculoskeletal: She exhibits tenderness.  Tender right wrist and right hand.  Pain  with range of motion, nv and ns intact   Neurological: She is alert.  Skin: Skin is warm.  Psychiatric: She has a normal mood and affect.  Nursing note and vitals reviewed.    ED Treatments / Results  Labs (all labs ordered are listed, but only abnormal results are displayed) Labs Reviewed - No data to display  EKG None  Radiology No results found.  Procedures Procedures (including critical care time)  Medications Ordered in ED Medications - No data to display   Initial Impression / Assessment and Plan / ED Course  I have reviewed the triage vital signs and the nursing notes.  Pertinent labs & imaging results that were  available during my care of the patient were reviewed by me and considered in my medical decision making (see chart for details).     MDM  Xray no fracture.  Pt placed in ace wrap.  Pt advised ice and ibuprofen  Final Clinical Impressions(s) / ED Diagnoses   Final diagnoses:  Contusion of right hand, initial encounter  Contusion of right wrist, initial encounter    ED Discharge Orders    None    An After Visit Summary was printed and given to the patient.    Fransico Meadow, PA-C 01/15/18 0041    Orpah Greek, MD 01/15/18 [phone]

## 2020-05-04 ENCOUNTER — Other Ambulatory Visit: Payer: Self-pay

## 2020-05-04 ENCOUNTER — Encounter (HOSPITAL_COMMUNITY): Payer: Self-pay

## 2020-05-04 ENCOUNTER — Ambulatory Visit (HOSPITAL_COMMUNITY)
Admission: EM | Admit: 2020-05-04 | Discharge: 2020-05-04 | Disposition: A | Discharge status: Home / Self Care | Payer: Medicare Other | Attending: Urgent Care | Admitting: Urgent Care

## 2020-05-04 ENCOUNTER — Ambulatory Visit (INDEPENDENT_AMBULATORY_CARE_PROVIDER_SITE_OTHER): Payer: Medicare Other

## 2020-05-04 DIAGNOSIS — M25562 Pain in left knee: Secondary | ICD-10-CM

## 2020-05-04 DIAGNOSIS — S8392XA Sprain of unspecified site of left knee, initial encounter: Secondary | ICD-10-CM

## 2020-05-04 MED ORDER — MELOXICAM 7.5 MG PO TABS: 7.5000 mg | ORAL_TABLET | Freq: Every day | ORAL | 0 refills | Status: DC

## 2020-05-04 NOTE — ED Triage Notes (Signed)
Pt presents with left knee injury after a fall last night.

## 2020-05-04 NOTE — ED Provider Notes (Signed)
[place]   MRN: [id number] DOB: Aug 02, 1955  Subjective:   Isabella [name] is a 65 y.o. female presenting for 1 day history of suffering a left knee injury.  Patient states that she slipped and ended up falling, feeling like she hurt her knee.  She has had exquisite tenderness, difficulty bearing weight.  Has a history of a fracture in the same knee, status post surgical repair about 5 years ago.  She has propped her leg up and limited her movement.  No current facility-administered medications for this encounter.  Current Outpatient Medications:  .  aspirin EC 81 MG tablet, Take 81 mg by mouth daily., Disp: , Rfl:  .  ketorolac (TORADOL) 10 MG tablet, Take 1 tablet (10 mg total) by mouth every 6 (six) hours as needed for moderate pain., Disp: 20 tablet, Rfl: 0 .  meloxicam (MOBIC) 7.5 MG tablet, Take 1 tablet (7.5 mg total) by mouth 2 (two) times daily., Disp: 60 tablet, Rfl: 1 .  methocarbamol (ROBAXIN) 500 MG tablet, Take 500 mg by mouth 2 (two) times daily as needed for muscle spasms., Disp: , Rfl:  .  metoprolol tartrate (LOPRESSOR) 25 MG tablet, Take 0.5 tablets (12.5 mg total) by mouth 2 (two) times daily., Disp: 30 tablet, Rfl: 9 .  nitroGLYCERIN (NITROSTAT) 0.4 MG SL tablet, Place 1 tablet (0.4 mg total) under the tongue every 5 (five) minutes as needed for chest pain., Disp: 25 tablet, Rfl: 3 .  OMEGA-3 FATTY ACIDS PO, Take 1 capsule by mouth daily., Disp: , Rfl:  .  oxyCODONE-acetaminophen (ROXICET) 5-325 MG tablet, Take 1-2 tablets by mouth every 8 (eight) hours as needed for severe pain., Disp: 60 tablet, Rfl: 0 .  phenazopyridine (PYRIDIUM) 200 MG tablet, Take 200 mg by mouth 3 (three) times daily as needed for pain., Disp: , Rfl:  .  promethazine (PHENERGAN) 12.5 MG tablet, Take 1-2 tablets (12.5-25 mg total) by mouth every 6 (six) hours as needed for nausea or vomiting., Disp: 20 tablet, Rfl: 0 .  VITAMIN A PO, Take 1 capsule by mouth daily., Disp: , Rfl:  .   vitamin C (VITAMIN C) 500 MG tablet, Take 1 tablet (500 mg total) by mouth daily., Disp: 60 tablet, Rfl: 2 .  VITAMIN E PO, Take 1 capsule by mouth daily., Disp: , Rfl:    No Known Allergies  Past Medical History:  Diagnosis Date  . Breast cancer ([place]) 2009   Lumpectomy, chemo and XRT  . Fracture, femur, supracondylar ([place]), left 02/12/2015  . Hypertension   . Personal history of chemotherapy   . Personal history of radiation therapy   . Pre-diabetes     diabetic coordinator confirmed  . Vitamin D deficiency 02/15/2015     Past Surgical History:  Procedure Laterality Date  . BREAST LUMPECTOMY Right 2009  . CARDIAC CATHETERIZATION N/A 07/29/2015   Procedure: Left Heart Cath and Cors/Grafts Angiography;  Surgeon: Leonie Man, MD;  Location: [facility];  Service: Cardiovascular;  Laterality: N/A;  . CARPAL TUNNEL RELEASE Right 2005  . FEMUR IM NAIL Left 02/12/2015   Procedure: INTRAMEDULLARY (IM) RETROGRADE FEMORAL NAILING;  Surgeon: Altamese Mutual, MD;  Location: [facility];  Service: Orthopedics;  Laterality: Left;  . HARDWARE REMOVAL Left 01/20/2016   Procedure: HARDWARE REMOVAL LEFT FEMUR;  Surgeon: Altamese Brownsboro, MD;  Location: [facility];  Service: Orthopedics;  Laterality: Left;  . KNEE ARTHROSCOPY Left 01/20/2016   Procedure: ARTHROSCOPY KNEE;  Surgeon: Legrand Como  Marcelino Scot, MD;  Location: [facility];  Service: Orthopedics;  Laterality: Left;    Family History  Problem Relation Age of Onset  . Heart attack Other     Social History   Tobacco Use  . Smoking status: Never Smoker  . Smokeless tobacco: Never Used  Substance Use Topics  . Alcohol use: No  . Drug use: No    ROS   Objective:   Vitals: BP 137/87 (BP Location: Right Arm)   Pulse 76   Temp 98.7 F (37.1 C) (Oral)   Resp 17   SpO2 97%   Physical Exam Constitutional:      General: She is not in acute distress.    Appearance: Normal appearance. She is well-developed. She is not ill-appearing.  HENT:     Head:  Normocephalic and atraumatic.     Nose: Nose normal.     Mouth/Throat:     Mouth: Mucous membranes are moist.     Pharynx: Oropharynx is clear.  Eyes:     General: No scleral icterus.    Extraocular Movements: Extraocular movements intact.     Pupils: Pupils are equal, round, and reactive to light.  Cardiovascular:     Rate and Rhythm: Normal rate.  Pulmonary:     Effort: Pulmonary effort is normal.  Musculoskeletal:     Left knee: Bony tenderness present. No swelling, deformity, effusion, erythema, ecchymosis, lacerations or crepitus. Decreased range of motion. Tenderness present over the medial joint line, lateral joint line and patellar tendon. Normal alignment and normal patellar mobility.  Skin:    General: Skin is warm and dry.  Neurological:     General: No focal deficit present.     Mental Status: She is alert and oriented to person, place, and time.  Psychiatric:        Mood and Affect: Mood normal.        Behavior: Behavior normal.     DG Knee Complete 4 Views Left  Result Date: 05/04/2020 CLINICAL DATA:  65 year old female with trauma to the left knee. EXAM: LEFT KNEE - COMPLETE 4+ VIEW COMPARISON:  Left knee radiograph dated 08/05/2016. FINDINGS: There is no acute fracture or dislocation. The bones are osteopenic. Old healed fracture deformity of the distal femur. Mild arthritic changes of the knee. There is a small suprapatellar effusion. The soft tissues are unremarkable. IMPRESSION: 1. No acute fracture or dislocation. 2. Old healed fracture deformity of the distal femur. Electronically Signed   By: Anner Crete M.D.   On: 05/04/2020 17:46   Applied a 4 inch Ace wrap to the left knee.  Assessment and Plan :   PDMP not reviewed this encounter.  1. Sprain of left knee, unspecified ligament, initial encounter   2. Acute pain of left knee     We will manage conservatively for knee sprain, use RICE method.  Meloxicam for pain and inflammation. Counseled patient  on potential for adverse effects with medications prescribed/recommended today, ER and return-to-clinic precautions discussed, patient verbalized understanding.    Jaynee Eagles, [place] 05/04/20 1806

## 2023-02-04 ENCOUNTER — Emergency Department (HOSPITAL_BASED_OUTPATIENT_CLINIC_OR_DEPARTMENT_OTHER): Payer: 59 | Admitting: Radiology

## 2023-02-04 ENCOUNTER — Other Ambulatory Visit: Payer: Self-pay

## 2023-02-04 ENCOUNTER — Encounter (HOSPITAL_BASED_OUTPATIENT_CLINIC_OR_DEPARTMENT_OTHER): Payer: Self-pay | Admitting: *Deleted

## 2023-02-04 DIAGNOSIS — S63614A Unspecified sprain of right ring finger, initial encounter: Secondary | ICD-10-CM | POA: Insufficient documentation

## 2023-02-04 DIAGNOSIS — W1839XA Other fall on same level, initial encounter: Secondary | ICD-10-CM | POA: Insufficient documentation

## 2023-02-04 DIAGNOSIS — Z7982 Long term (current) use of aspirin: Secondary | ICD-10-CM | POA: Diagnosis not present

## 2023-02-04 DIAGNOSIS — S6991XA Unspecified injury of right wrist, hand and finger(s), initial encounter: Secondary | ICD-10-CM | POA: Diagnosis present

## 2023-02-04 NOTE — ED Triage Notes (Signed)
Right ring finger pain and swelling since she fell over her dog yesterday.

## 2023-02-05 ENCOUNTER — Emergency Department (HOSPITAL_BASED_OUTPATIENT_CLINIC_OR_DEPARTMENT_OTHER)
Admission: EM | Admit: 2023-02-05 | Discharge: 2023-02-05 | Disposition: A | Discharge status: Home / Self Care | Payer: 59 | Attending: Emergency Medicine | Admitting: Emergency Medicine

## 2023-02-05 DIAGNOSIS — S63614A Unspecified sprain of right ring finger, initial encounter: Secondary | ICD-10-CM

## 2023-02-05 MED ORDER — NAPROXEN 500 MG PO TABS: 500.0000 mg | ORAL_TABLET | Freq: Two times a day (BID) | ORAL | 0 refills | Status: DC

## 2023-02-05 MED ORDER — NAPROXEN 500 MG PO TABS: 500.0000 mg | ORAL_TABLET | Freq: Two times a day (BID) | ORAL | 0 refills | Status: AC

## 2023-02-05 MED ORDER — NAPROXEN 250 MG PO TABS
500.0000 mg | ORAL_TABLET | Freq: Once | ORAL | Status: AC
Start: 2023-02-05 — End: 2023-02-05
  Administered 2023-02-05: 500 mg via ORAL
  Filled 2023-02-05: qty 2

## 2023-02-05 NOTE — ED Provider Notes (Signed)
[place] EMERGENCY DEPARTMENT AT [facility]  Provider Note  CSN: [id number] Arrival date & time: 02/04/23 1939  History Chief Complaint  Patient presents with   Finger Injury    Isabella [name] is a 67 y.o. female fell over her dog injuring her R ring finger yesterday. Has had increased pain and swelling since then.    Home Medications Prior to Admission medications   Medication Sig Start Date End Date Taking? Authorizing Provider  naproxen (NAPROSYN) 500 MG tablet Take 1 tablet (500 mg total) by mouth 2 (two) times daily. 02/05/23  Yes Pollyann Savoy, MD  aspirin EC 81 MG tablet Take 81 mg by mouth daily.    [provider]  methocarbamol (ROBAXIN) 500 MG tablet Take 500 mg by mouth 2 (two) times daily as needed for muscle spasms.    [provider]  metoprolol tartrate (LOPRESSOR) 25 MG tablet Take 0.5 tablets (12.5 mg total) by mouth 2 (two) times daily. 07/24/15   Abelino Derrick, PA-C  nitroGLYCERIN (NITROSTAT) 0.4 MG SL tablet Place 1 tablet (0.4 mg total) under the tongue every 5 (five) minutes as needed for chest pain. 07/24/15   Abelino Derrick, PA-C  OMEGA-3 FATTY ACIDS PO Take 1 capsule by mouth daily.    [provider]  oxyCODONE-acetaminophen (ROXICET) 5-325 MG tablet Take 1-2 tablets by mouth every 8 (eight) hours as needed for severe pain. 01/20/16   Montez Morita, PA-C  phenazopyridine (PYRIDIUM) 200 MG tablet Take 200 mg by mouth 3 (three) times daily as needed for pain.    [provider]  promethazine (PHENERGAN) 12.5 MG tablet Take 1-2 tablets (12.5-25 mg total) by mouth every 6 (six) hours as needed for nausea or vomiting. 01/20/16   Montez Morita, PA-C  VITAMIN A PO Take 1 capsule by mouth daily.    [provider]  vitamin C (VITAMIN C) 500 MG tablet Take 1 tablet (500 mg total) by mouth daily. 02/15/15   Montez Morita, PA-C  VITAMIN E PO Take 1 capsule by mouth daily.    [provider]     Allergies     Patient has no known allergies.   Review of Systems   Review of Systems Please see HPI for pertinent positives and negatives  Physical Exam BP 134/70   Pulse (!) 55   Temp 97.9 F (36.6 C) (Oral)   Resp 20   SpO2 95%   Physical Exam Vitals and nursing note reviewed.  HENT:     Head: Normocephalic.     Nose: Nose normal.  Eyes:     Extraocular Movements: Extraocular movements intact.  Pulmonary:     Effort: Pulmonary effort is normal.  Musculoskeletal:        General: Swelling (R ring finger) and tenderness present. No deformity. Normal range of motion.     Cervical back: Neck supple.  Skin:    Findings: No rash (on exposed skin).  Neurological:     Mental Status: She is alert and oriented to person, place, and time.  Psychiatric:        Mood and Affect: Mood normal.     ED Results / Procedures / Treatments   EKG None  Procedures Procedures  Medications Ordered in the ED Medications  naproxen (NAPROSYN) tablet 500 mg (has no administration in time range)    Initial Impression and Plan  Patient here with R ring finger injury. Xray ordered in triage read by radiology as negatove, however  the ring finger itself was not actually imaged. Rad Tech will redo xrays to include the ring finger.   ED Course   Clinical Course as of 02/05/23 0249  Fri Feb 05, 2023  [id number] I personally viewed the images from radiology studies and agree with radiologist interpretation: Correct finger images are negative for fracture. Plan splint and NSAIDs for comfort.  [CS]    Clinical Course User Index [CS] Pollyann Savoy, MD     MDM Rules/Calculators/A&P Medical Decision Making Problems Addressed: Sprain of right ring finger, unspecified site of digit, initial encounter: acute illness or injury  Amount and/or Complexity of Data Reviewed Radiology: ordered and independent interpretation performed. Decision-making details documented in ED Course.  Risk Prescription drug  management.     Final Clinical Impression(s) / ED Diagnoses Final diagnoses:  Sprain of right ring finger, unspecified site of digit, initial encounter    Rx / DC Orders ED Discharge Orders          Ordered    naproxen (NAPROSYN) 500 MG tablet  2 times daily        02/05/23 0249             Pollyann Savoy, MD 02/05/23 [phone]

## 2023-02-05 NOTE — ED Notes (Signed)
Discharge instructions discussed with pt. Pt verbalized understanding. Pt stable and ambulatory.  °
